# Patient Record
Sex: Female | Born: 1955
Health system: Southern US, Community
[De-identification: ages and names within clinical notes are randomized; demographics above are authoritative.]

## PROBLEM LIST (undated history)

## (undated) DIAGNOSIS — E119 Type 2 diabetes mellitus without complications: Secondary | ICD-10-CM

## (undated) DIAGNOSIS — K649 Unspecified hemorrhoids: Secondary | ICD-10-CM

## (undated) DIAGNOSIS — G459 Transient cerebral ischemic attack, unspecified: Secondary | ICD-10-CM

## (undated) DIAGNOSIS — N809 Endometriosis, unspecified: Secondary | ICD-10-CM

## (undated) DIAGNOSIS — K219 Gastro-esophageal reflux disease without esophagitis: Secondary | ICD-10-CM

## (undated) DIAGNOSIS — O039 Complete or unspecified spontaneous abortion without complication: Secondary | ICD-10-CM

## (undated) DIAGNOSIS — F419 Anxiety disorder, unspecified: Secondary | ICD-10-CM

## (undated) DIAGNOSIS — D649 Anemia, unspecified: Secondary | ICD-10-CM

## (undated) DIAGNOSIS — E785 Hyperlipidemia, unspecified: Secondary | ICD-10-CM

## (undated) HISTORY — PX: OTHER SURGICAL HISTORY: SHX169

## (undated) HISTORY — DX: Anxiety disorder, unspecified: F41.9

## (undated) HISTORY — DX: Type 2 diabetes mellitus without complications: E11.9

## (undated) HISTORY — DX: Endometriosis, unspecified: N80.9

## (undated) HISTORY — DX: Transient cerebral ischemic attack, unspecified: G45.9

## (undated) HISTORY — PX: KNEE ARTHROSCOPY W/ MENISCAL REPAIR: SHX1877

## (undated) HISTORY — PX: PELVIC LAPAROSCOPY: SHX162

## (undated) HISTORY — DX: Complete or unspecified spontaneous abortion without complication: O03.9

---

## 1997-07-01 ENCOUNTER — Ambulatory Visit (HOSPITAL_COMMUNITY): Admission: RE | Admit: 1997-07-01 | Discharge: 1997-07-01 | Payer: Self-pay | Admitting: Obstetrics and Gynecology

## 1997-07-07 ENCOUNTER — Ambulatory Visit (HOSPITAL_COMMUNITY): Admission: RE | Admit: 1997-07-07 | Discharge: 1997-07-07 | Payer: Self-pay | Admitting: Obstetrics and Gynecology

## 1997-09-04 ENCOUNTER — Other Ambulatory Visit: Admission: RE | Admit: 1997-09-04 | Discharge: 1997-09-04 | Payer: Self-pay | Admitting: Obstetrics and Gynecology

## 1997-10-15 ENCOUNTER — Emergency Department (HOSPITAL_COMMUNITY): Admission: EM | Admit: 1997-10-15 | Discharge: 1997-10-15 | Payer: Self-pay | Admitting: Emergency Medicine

## 1998-01-05 ENCOUNTER — Ambulatory Visit (HOSPITAL_COMMUNITY): Admission: RE | Admit: 1998-01-05 | Discharge: 1998-01-05 | Payer: Self-pay | Admitting: Obstetrics and Gynecology

## 1998-02-09 ENCOUNTER — Emergency Department (HOSPITAL_COMMUNITY): Admission: EM | Admit: 1998-02-09 | Discharge: 1998-02-09 | Payer: Self-pay | Admitting: Internal Medicine

## 1998-02-09 ENCOUNTER — Encounter: Payer: Self-pay | Admitting: Emergency Medicine

## 1998-05-09 ENCOUNTER — Emergency Department (HOSPITAL_COMMUNITY): Admission: EM | Admit: 1998-05-09 | Discharge: 1998-05-09 | Payer: Self-pay | Admitting: Emergency Medicine

## 1998-05-09 ENCOUNTER — Encounter: Payer: Self-pay | Admitting: Emergency Medicine

## 1998-08-09 ENCOUNTER — Emergency Department (HOSPITAL_COMMUNITY): Admission: EM | Admit: 1998-08-09 | Discharge: 1998-08-09 | Payer: Self-pay

## 1998-09-09 ENCOUNTER — Encounter: Payer: Self-pay | Admitting: Obstetrics and Gynecology

## 1998-09-09 ENCOUNTER — Ambulatory Visit (HOSPITAL_COMMUNITY): Admission: RE | Admit: 1998-09-09 | Discharge: 1998-09-09 | Payer: Self-pay | Admitting: Obstetrics and Gynecology

## 1999-01-17 ENCOUNTER — Encounter: Payer: Self-pay | Admitting: Surgery

## 1999-01-18 ENCOUNTER — Ambulatory Visit (HOSPITAL_COMMUNITY): Admission: RE | Admit: 1999-01-18 | Discharge: 1999-01-19 | Payer: Self-pay | Admitting: Surgery

## 1999-01-27 ENCOUNTER — Other Ambulatory Visit: Admission: RE | Admit: 1999-01-27 | Discharge: 1999-01-27 | Payer: Self-pay | Admitting: Obstetrics and Gynecology

## 1999-05-02 HISTORY — PX: LAPAROSCOPIC CHOLECYSTECTOMY: SUR755

## 1999-08-22 ENCOUNTER — Encounter: Payer: Self-pay | Admitting: Obstetrics and Gynecology

## 1999-08-22 ENCOUNTER — Encounter: Admission: RE | Admit: 1999-08-22 | Discharge: 1999-08-22 | Payer: Self-pay | Admitting: Obstetrics and Gynecology

## 1999-10-03 ENCOUNTER — Emergency Department (HOSPITAL_COMMUNITY): Admission: EM | Admit: 1999-10-03 | Discharge: 1999-10-03 | Payer: Self-pay | Admitting: Cardiology

## 2000-02-02 ENCOUNTER — Other Ambulatory Visit: Admission: RE | Admit: 2000-02-02 | Discharge: 2000-02-02 | Payer: Self-pay | Admitting: Obstetrics and Gynecology

## 2000-08-10 ENCOUNTER — Encounter: Payer: Self-pay | Admitting: Obstetrics and Gynecology

## 2000-08-10 ENCOUNTER — Encounter: Admission: RE | Admit: 2000-08-10 | Discharge: 2000-08-10 | Payer: Self-pay | Admitting: Obstetrics and Gynecology

## 2000-08-31 ENCOUNTER — Encounter: Payer: Self-pay | Admitting: Obstetrics and Gynecology

## 2000-08-31 ENCOUNTER — Encounter: Admission: RE | Admit: 2000-08-31 | Discharge: 2000-08-31 | Payer: Self-pay | Admitting: Obstetrics and Gynecology

## 2000-10-06 ENCOUNTER — Emergency Department (HOSPITAL_COMMUNITY): Admission: EM | Admit: 2000-10-06 | Discharge: 2000-10-07 | Payer: Self-pay | Admitting: Emergency Medicine

## 2000-10-07 ENCOUNTER — Encounter: Payer: Self-pay | Admitting: Emergency Medicine

## 2000-12-06 ENCOUNTER — Ambulatory Visit (HOSPITAL_COMMUNITY): Admission: RE | Admit: 2000-12-06 | Discharge: 2000-12-06 | Payer: Self-pay | Admitting: Gastroenterology

## 2001-01-07 ENCOUNTER — Encounter: Payer: Self-pay | Admitting: Obstetrics and Gynecology

## 2001-01-07 ENCOUNTER — Encounter: Admission: RE | Admit: 2001-01-07 | Discharge: 2001-01-07 | Payer: Self-pay | Admitting: Obstetrics and Gynecology

## 2001-05-10 ENCOUNTER — Encounter: Payer: Self-pay | Admitting: Orthopedic Surgery

## 2001-05-10 ENCOUNTER — Encounter: Admission: RE | Admit: 2001-05-10 | Discharge: 2001-05-10 | Payer: Self-pay | Admitting: Orthopedic Surgery

## 2001-10-18 ENCOUNTER — Ambulatory Visit (HOSPITAL_COMMUNITY): Admission: RE | Admit: 2001-10-18 | Discharge: 2001-10-18 | Payer: Self-pay | Admitting: Obstetrics and Gynecology

## 2001-10-18 ENCOUNTER — Encounter: Payer: Self-pay | Admitting: Obstetrics and Gynecology

## 2002-05-01 DIAGNOSIS — G459 Transient cerebral ischemic attack, unspecified: Secondary | ICD-10-CM

## 2002-05-01 HISTORY — DX: Transient cerebral ischemic attack, unspecified: G45.9

## 2002-05-24 ENCOUNTER — Emergency Department (HOSPITAL_COMMUNITY): Admission: EM | Admit: 2002-05-24 | Discharge: 2002-05-24 | Payer: Self-pay | Admitting: Emergency Medicine

## 2002-05-24 ENCOUNTER — Encounter: Payer: Self-pay | Admitting: Emergency Medicine

## 2002-05-29 ENCOUNTER — Other Ambulatory Visit: Admission: RE | Admit: 2002-05-29 | Discharge: 2002-05-29 | Payer: Self-pay | Admitting: *Deleted

## 2002-06-20 ENCOUNTER — Encounter: Payer: Self-pay | Admitting: Family Medicine

## 2002-06-20 ENCOUNTER — Encounter: Admission: RE | Admit: 2002-06-20 | Discharge: 2002-06-20 | Payer: Self-pay | Admitting: Family Medicine

## 2002-08-13 ENCOUNTER — Emergency Department (HOSPITAL_COMMUNITY): Admission: EM | Admit: 2002-08-13 | Discharge: 2002-08-13 | Payer: Self-pay

## 2002-08-14 ENCOUNTER — Encounter: Payer: Self-pay | Admitting: Emergency Medicine

## 2002-10-21 ENCOUNTER — Encounter: Payer: Self-pay | Admitting: *Deleted

## 2002-10-21 ENCOUNTER — Ambulatory Visit (HOSPITAL_COMMUNITY): Admission: RE | Admit: 2002-10-21 | Discharge: 2002-10-21 | Payer: Self-pay | Admitting: *Deleted

## 2003-02-26 ENCOUNTER — Inpatient Hospital Stay (HOSPITAL_COMMUNITY): Admission: EM | Admit: 2003-02-26 | Discharge: 2003-02-27 | Payer: Self-pay | Admitting: Emergency Medicine

## 2003-02-27 ENCOUNTER — Encounter: Payer: Self-pay | Admitting: Internal Medicine

## 2003-07-02 ENCOUNTER — Other Ambulatory Visit: Admission: RE | Admit: 2003-07-02 | Discharge: 2003-07-02 | Payer: Self-pay | Admitting: *Deleted

## 2004-05-26 ENCOUNTER — Emergency Department (HOSPITAL_COMMUNITY): Admission: EM | Admit: 2004-05-26 | Discharge: 2004-05-26 | Payer: Self-pay | Admitting: Emergency Medicine

## 2004-08-17 ENCOUNTER — Other Ambulatory Visit: Admission: RE | Admit: 2004-08-17 | Discharge: 2004-08-17 | Payer: Self-pay | Admitting: *Deleted

## 2004-12-21 ENCOUNTER — Emergency Department (HOSPITAL_COMMUNITY): Admission: EM | Admit: 2004-12-21 | Discharge: 2004-12-21 | Payer: Self-pay | Admitting: Emergency Medicine

## 2005-01-03 ENCOUNTER — Emergency Department (HOSPITAL_COMMUNITY): Admission: EM | Admit: 2005-01-03 | Discharge: 2005-01-03 | Payer: Self-pay | Admitting: Emergency Medicine

## 2005-08-18 ENCOUNTER — Emergency Department (HOSPITAL_COMMUNITY): Admission: EM | Admit: 2005-08-18 | Discharge: 2005-08-19 | Payer: Self-pay | Admitting: Emergency Medicine

## 2005-08-21 ENCOUNTER — Other Ambulatory Visit: Admission: RE | Admit: 2005-08-21 | Discharge: 2005-08-21 | Payer: Self-pay | Admitting: Obstetrics & Gynecology

## 2005-08-30 ENCOUNTER — Encounter: Payer: Self-pay | Admitting: Obstetrics and Gynecology

## 2005-09-01 ENCOUNTER — Encounter: Admission: RE | Admit: 2005-09-01 | Discharge: 2005-09-01 | Payer: Self-pay | Admitting: Family Medicine

## 2005-09-06 ENCOUNTER — Encounter: Admission: RE | Admit: 2005-09-06 | Discharge: 2005-09-06 | Payer: Self-pay | Admitting: Family Medicine

## 2005-09-18 ENCOUNTER — Encounter (INDEPENDENT_AMBULATORY_CARE_PROVIDER_SITE_OTHER): Payer: Self-pay | Admitting: Specialist

## 2005-09-18 ENCOUNTER — Ambulatory Visit (HOSPITAL_COMMUNITY): Admission: RE | Admit: 2005-09-18 | Discharge: 2005-09-18 | Payer: Self-pay | Admitting: Gastroenterology

## 2005-09-26 ENCOUNTER — Ambulatory Visit: Payer: Self-pay | Admitting: Internal Medicine

## 2005-12-04 ENCOUNTER — Ambulatory Visit: Payer: Self-pay | Admitting: Internal Medicine

## 2006-03-21 ENCOUNTER — Emergency Department (HOSPITAL_COMMUNITY): Admission: EM | Admit: 2006-03-21 | Discharge: 2006-03-21 | Payer: Self-pay | Admitting: Emergency Medicine

## 2006-03-28 ENCOUNTER — Ambulatory Visit: Payer: Self-pay | Admitting: Internal Medicine

## 2006-06-13 ENCOUNTER — Inpatient Hospital Stay (HOSPITAL_COMMUNITY): Admission: EM | Admit: 2006-06-13 | Discharge: 2006-06-14 | Payer: Self-pay | Admitting: Emergency Medicine

## 2006-06-14 ENCOUNTER — Encounter (INDEPENDENT_AMBULATORY_CARE_PROVIDER_SITE_OTHER): Payer: Self-pay | Admitting: *Deleted

## 2006-08-03 ENCOUNTER — Ambulatory Visit: Payer: Self-pay | Admitting: Internal Medicine

## 2006-08-23 ENCOUNTER — Other Ambulatory Visit: Admission: RE | Admit: 2006-08-23 | Discharge: 2006-08-23 | Payer: Self-pay | Admitting: Obstetrics & Gynecology

## 2007-04-23 IMAGING — CR DG CHEST 2V
2 series · 2 of 2 positions shown · non-contrast
Comparison: Report from prior chest radiograph dated 02/26/03.

CLINICAL DATA: Left chest pain.  
 2-VIEW CHEST RADIOGRAPH:

[w chest pa]
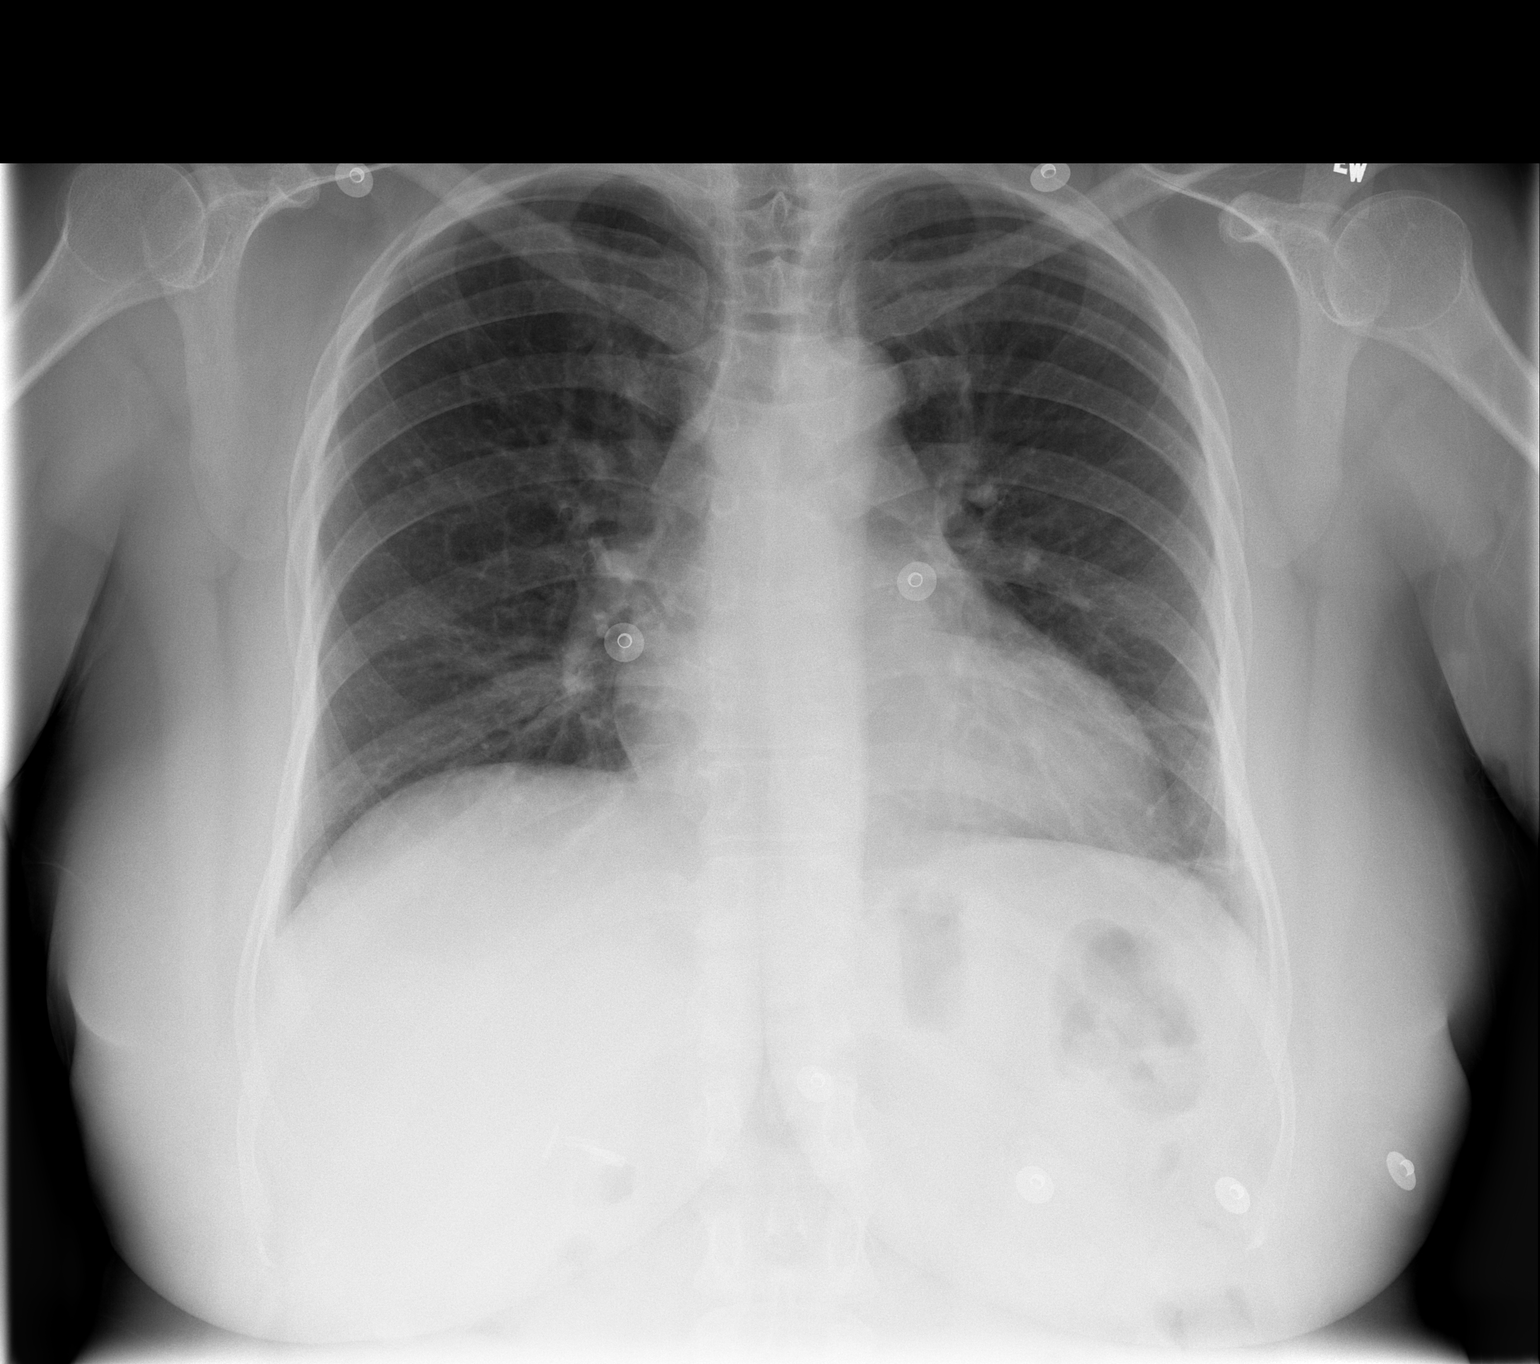

[w chest lat]
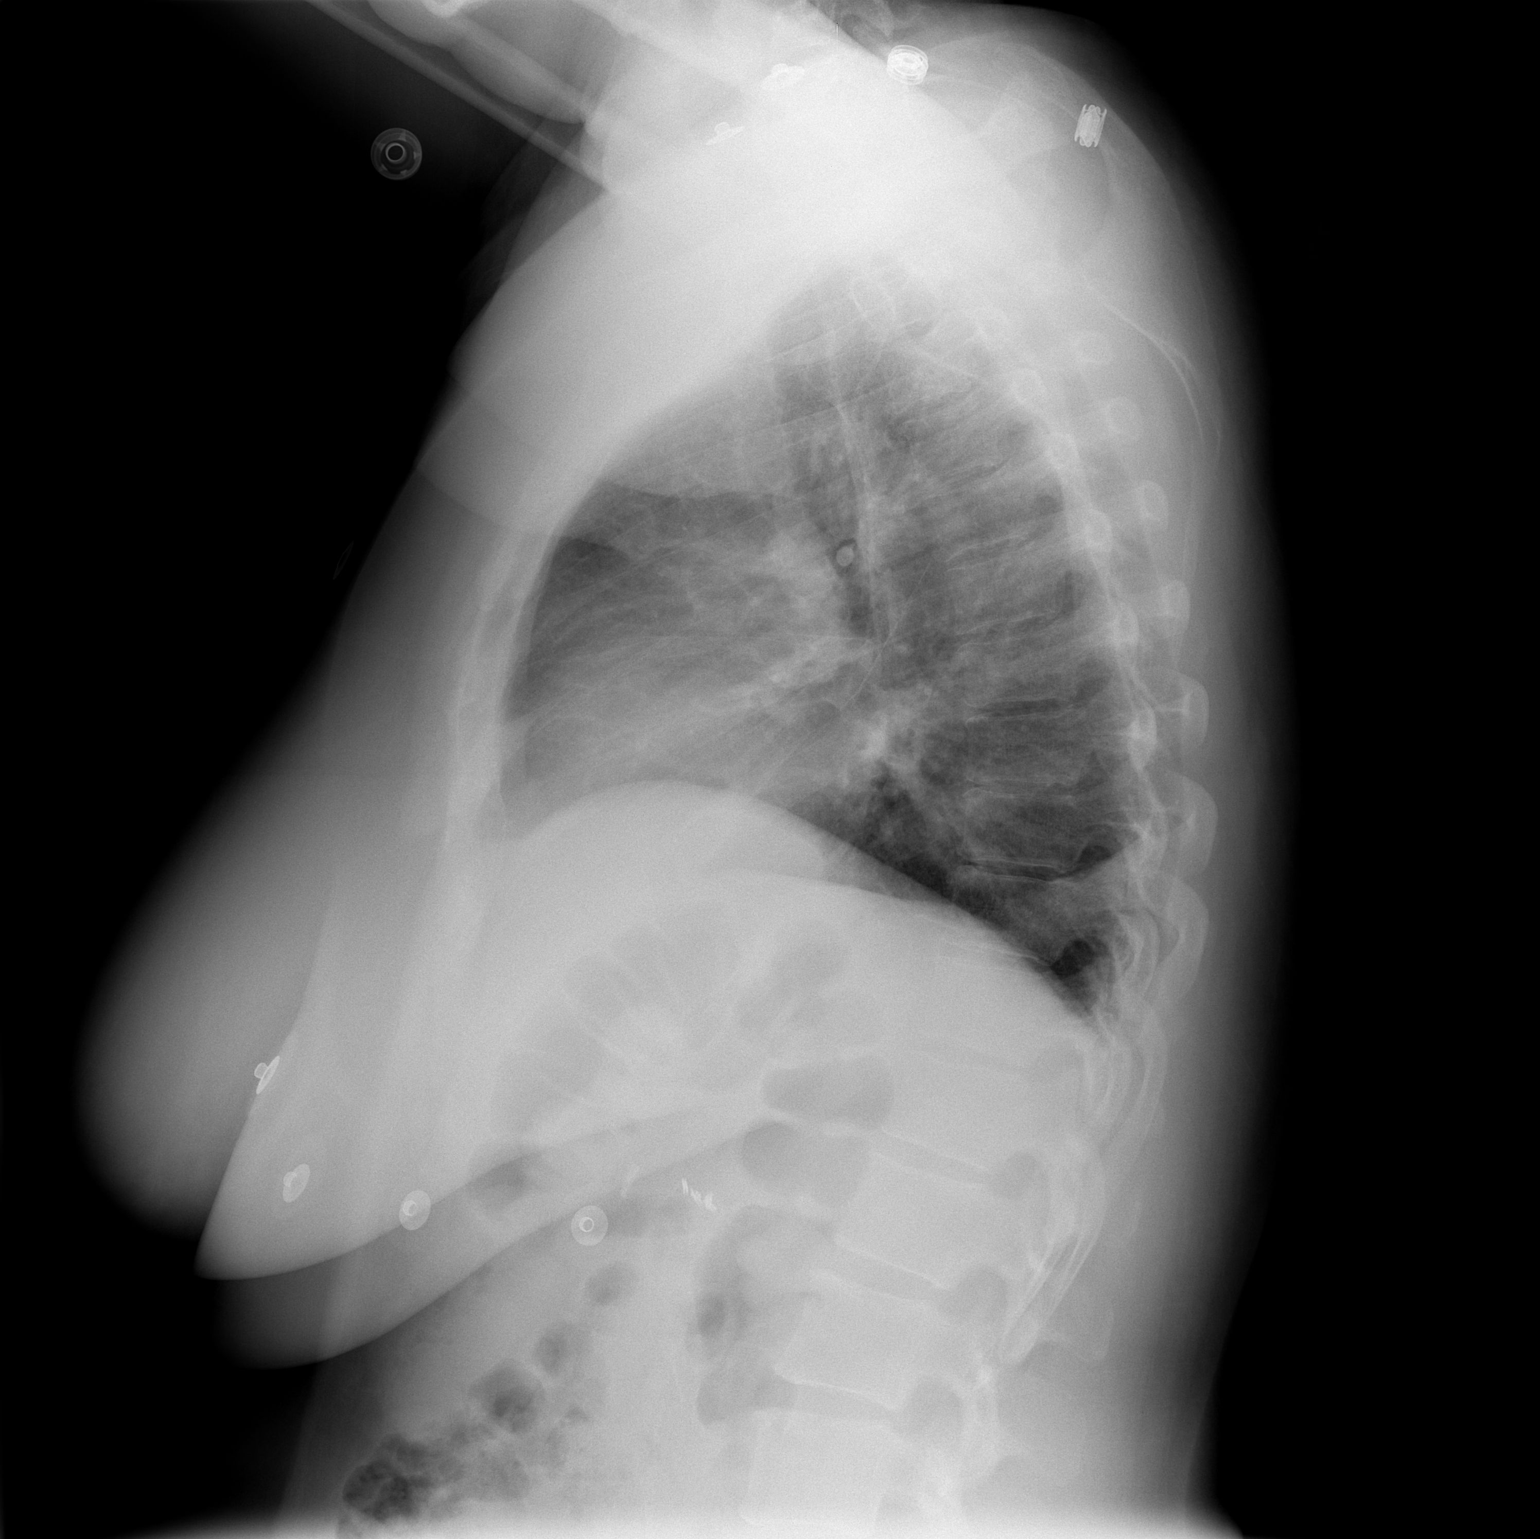

[2 of 2 positions shown; findings below may reference images not displayed]

FINDINGS: There is subsegmental atelectasis or scarring at the left costophrenic angle.  Similar findings were noted on the prior exam.  Right lung appears clear.  There is mild cardiomegaly.  No edema.  A focal density in the left hilar region likely represents granuloma.
IMPRESSION: 1.  Cardiomegaly but no edema. 
 2.  Subsegmental atelectasis versus scarring at the left base.

## 2007-05-03 ENCOUNTER — Emergency Department (HOSPITAL_COMMUNITY): Admission: EM | Admit: 2007-05-03 | Discharge: 2007-05-03 | Payer: Self-pay | Admitting: Emergency Medicine

## 2007-07-15 ENCOUNTER — Emergency Department (HOSPITAL_COMMUNITY): Admission: EM | Admit: 2007-07-15 | Discharge: 2007-07-15 | Payer: Self-pay | Admitting: Emergency Medicine

## 2007-08-28 ENCOUNTER — Other Ambulatory Visit: Admission: RE | Admit: 2007-08-28 | Discharge: 2007-08-28 | Payer: Self-pay | Admitting: Obstetrics & Gynecology

## 2010-01-09 ENCOUNTER — Emergency Department (HOSPITAL_BASED_OUTPATIENT_CLINIC_OR_DEPARTMENT_OTHER): Admission: EM | Admit: 2010-01-09 | Discharge: 2010-01-09 | Payer: Self-pay | Admitting: Emergency Medicine

## 2010-01-09 ENCOUNTER — Ambulatory Visit: Payer: Self-pay | Admitting: Diagnostic Radiology

## 2010-02-10 ENCOUNTER — Observation Stay (HOSPITAL_COMMUNITY): Admission: EM | Admit: 2010-02-10 | Discharge: 2010-02-10 | Payer: Self-pay | Admitting: Emergency Medicine

## 2010-02-10 ENCOUNTER — Ambulatory Visit: Payer: Self-pay | Admitting: Cardiology

## 2010-02-10 ENCOUNTER — Encounter (INDEPENDENT_AMBULATORY_CARE_PROVIDER_SITE_OTHER): Payer: Self-pay | Admitting: Emergency Medicine

## 2010-07-13 LAB — POCT CARDIAC MARKERS
Myoglobin, poc: 67.1 ng/mL (ref 12–200)
Myoglobin, poc: 77.2 ng/mL (ref 12–200)
Troponin i, poc: <0.05 ng/mL (ref 0.00–0.09)
Troponin i, poc: <0.05 ng/mL (ref 0.00–0.09)

## 2010-07-13 LAB — DIFFERENTIAL
Basophils Absolute: 0.1 10*3/uL (ref 0.0–0.1)
Eosinophils Absolute: 0.1 10*3/uL (ref 0.0–0.7)
Eosinophils Relative: 1 % (ref 0–5)
Lymphocytes Relative: 38 % (ref 12–46)
Monocytes Absolute: 0.7 10*3/uL (ref 0.1–1.0)

## 2010-07-13 LAB — CBC
HCT: 39.9 % (ref 36.0–46.0)
MCH: 30 pg (ref 26.0–34.0)
MCV: 89.3 fL (ref 78.0–100.0)
RDW: 13.1 % (ref 11.5–15.5)
WBC: 8.3 10*3/uL (ref 4.0–10.5)

## 2010-07-13 LAB — BASIC METABOLIC PANEL
BUN: 11 mg/dL (ref 6–23)
CO2: 27 mEq/L (ref 19–32)
Calcium: 8.9 mg/dL (ref 8.4–10.5)
Chloride: 105 mEq/L (ref 96–112)
Creatinine, Ser: 0.72 mg/dL (ref 0.4–1.2)
Glucose, Bld: 103 mg/dL — ABNORMAL HIGH (ref 70–99)

## 2010-07-14 LAB — DIFFERENTIAL
Basophils Absolute: 0.1 10*3/uL (ref 0.0–0.1)
Basophils Relative: 1 % (ref 0–1)
Eosinophils Absolute: 0.1 10*3/uL (ref 0.0–0.7)
Eosinophils Relative: 1 % (ref 0–5)
Monocytes Absolute: 0.6 10*3/uL (ref 0.1–1.0)
Monocytes Relative: 6 % (ref 3–12)

## 2010-07-14 LAB — COMPREHENSIVE METABOLIC PANEL
ALT: 23 U/L (ref 0–35)
AST: 33 U/L (ref 0–37)
Albumin: 3.7 g/dL (ref 3.5–5.2)
Alkaline Phosphatase: 65 U/L (ref 39–117)
BUN: 16 mg/dL (ref 6–23)
Calcium: 9 mg/dL (ref 8.4–10.5)
GFR calc non Af Amer: >60 mL/min (ref >60)
Glucose, Bld: 98 mg/dL (ref 70–99)
Total Bilirubin: 0.8 mg/dL (ref 0.3–1.2)

## 2010-07-14 LAB — POCT CARDIAC MARKERS: Myoglobin, poc: 52.2 ng/mL (ref 12–200)

## 2010-07-14 LAB — CBC
HCT: 36.6 % (ref 36.0–46.0)
Hemoglobin: 12.4 g/dL (ref 12.0–15.0)
MCH: 31 pg (ref 26.0–34.0)
MCHC: 34 g/dL (ref 30.0–36.0)

## 2010-09-16 NOTE — Discharge Summary (Signed)
Jasmine Hicks, Jasmine Hicks                      ACCOUNT NO.:  [id number]   MEDICAL RECORD NO.:  [id number]                   PATIENT TYPE:  INP   LOCATION:  3040                                 FACILITY:  [facility]   PHYSICIAN:  Pramod P. Pearlean Brownie, MD                 DATE OF BIRTH:  22-Mar-1956   DATE OF ADMISSION:  02/26/2003  DATE OF DISCHARGE:  02/27/2003                                 DISCHARGE SUMMARY   DIAGNOSES AT TIME OF DISCHARGE:  1. Right brain transient ischemic attack.  2. Hyperlipidemia.  3. History of epistaxis.  4. Probable allergic rhinitis.  5. Migraines.  6. Hypertension.   MEDICATIONS ON ADMISSION:  Per records, on no medicines prior to admission.   MEDICATIONS AT TIME OF DISCHARGE:  1. Aspirin 81 mg daily.  2. Zocor 20 mg daily.  3. Cipro 250 mg b.i.d. x3 days.  4. Clarinex 5 mg a day p.r.n.   STUDIES PERFORMED:  1. CT of the head as negative on admission for acute stroke or abnormality.  2. MRI of the brain negative for acute stroke.  3. MRA of the neck normal.  4. Carotid Doppler normal without ICA stenosis, vertebral flow antegrade.  5. A 2-D echocardiogram with no embolic source, EF 65%.   LABORATORY STUDIES:  Cholesterol 241, triglycerides 106, HDL 50, LDL 170,  homocystine completed but results are pending.  Glucose elevated at 104.  Otherwise CMET and CBC within normal limits.  Coagulation studies are  normal.   HISTORY OF PRESENT ILLNESS:  The patient is a 55 year old Hispanic, right-  handed female with a history of nose bleed, who noted onset of severe  headache and hypertension the morning of February 26, 2003.  She went to her  family doctor's office, and they noticed some left facial droop.  She  reported tingling.  She was sent to the emergency room where a CT scan of  the head was negative for acute stroke.  She was admitted to the hospital  for further workup.   No acute infarct was noted.  It was felt the patient probably had right  brain  transient ischemic attack associated with left facial symptoms.  The  patient was started on aspirin in the hospital for secondary prevention.  We  will decrease to 81 mg with history of epistaxis.  She prefers __________  Aggrenox or Plavix at this time.   RISK FACTORS IDENTIFIED:  1. Elevated cholesterol with significantly elevated LDL at 170.  We will     start her on statin for this.  Will need follow up in six to eight weeks.  2. Other studies negative for risk factors except for homocystine which     remains pending at time of discharge.  3. Neurological deficits, resolved.   DISCHARGE CONDITION:  1. No focal deficits.  2. No neurological abnormalities.   DISCHARGE PLAN:  1. Discharge home.  2. Aspirin  81 mg for secondary stroke prevention.  3. Statin for elevated cholesterol.  Will need followup in six to eight     weeks.  4. Followup homocystine as outpatient.  If elevated, treated with Foltx one     daily.  Include vitamin B complex.  5. Follow up with primary physician in one month.  6. Follow up with Dr. Janalyn Shy P. Sethi in two months.      Jasmine Hicks, N.P.                         Pramod P. Pearlean Brownie, MD    SB/MEDQ  D:  02/27/2003  T:  02/27/2003  Job:  [id number]

## 2010-09-16 NOTE — Assessment & Plan Note (Signed)
 HEALTHCARE                             PULMONARY OFFICE NOTE   SIDONIA, NUTTER                   MRN:          [id number]  DATE:08/03/2006                            DOB:          1955/09/25    PULMONARY/FINAL FOLLOWUP OFFICE VISIT   A 55 year old female with a chronic cough that has waxed and waned since  May of 2007, overall much better when she remembers to take Zegerid, but  worse with the change of seasons, associated with watery rhinitis and  sensation of post-nasal drainage.  She is taking claritin with minimum  benefit.  She denies any pleuritic pain, fevers, chills, sweats,  unintended weight loss, or history of hemoptysis.   PHYSICAL EXAMINATION:  She is a pleasant, ambulatory female in no acute  distress.  VITAL SIGNS:  Stable vital signs.  HEENT:  Minimal turbinate edema with nonspecific features.  Oropharynx  is clear.  NECK:  Supple without cervical adenopathy or tenderness. Trachea is  midline.  No thyromegaly.  LUNGS:  Fields are perfectly clear bilaterally to auscultation and  percussion.  CARDIAC:  Regular rate and rhythm without murmur, gallop, or rub.  ABDOMEN:  Soft and benign.  EXTREMITIES:  Warm without calf tenderness, cyanosis, clubbing, or  edema.   Chest x-ray does not reveal any nodule in the right middle lobe  distribution.   IMPRESSION:  1. Microscopic nodule in the right middle lobe by CT scan over a      year ago with no plain x-ray equivalent. Given the fact that she is      a never smoker and the distribution of this nodule, I think it is      extremely unlikely that it is malignant.  I would recommend that      she simply have it followed up with a chest x-ray in a year.  2. In terms of her cough, I believe that she has nonspecific and/or      allergic rhinitis with post-nasal drainage that then triggers      coughing, which then triggers reflux.  I have recommended a trial      of Drixoral Cold  and Allergy p.r.n., but also recommended that,      whenever she is coughing, she needs to remember to take the Zegerid      perfectly regularly 40 mg at bedtime to eliminate the cyclical      component of the cough that tends to develop in this setting.     Charlaine Dalton. Sherene Sires, MD, [facility]  Electronically Signed    MBW/MedQ  DD: 08/03/2006  DT: 08/04/2006  Job #: [id number]

## 2010-09-16 NOTE — H&P (Signed)
NAMEBAHAR, SHELDEN NO.:  [id number]   MEDICAL RECORD NO.:  [id number]                   PATIENT TYPE:  INP   LOCATION:  1828                                 FACILITY:  [facility]   PHYSICIAN:  Melvyn Novas, M.D.               DATE OF BIRTH:  09/21/1955   DATE OF ADMISSION:  02/26/2003  DATE OF DISCHARGE:                                HISTORY & PHYSICAL   IDENTIFYING INFORMATION:  Mrs. Gritz is admitted on February 26, 2003 to  the stroke MD service.   PRIMARY CARE PHYSICIAN:  Francis P. Modesto Charon, M.D., [facility] at  [facility], [address]   REASON FOR ADMISSION:  This is a pleasant, 55 year old Filipino female,  right-handed who states that she has a history of frequent, sometimes  violent nosebleeds and reports, this morning, onset of headache,  hypertension in the early a.m. that urged her to see her to see her primary  care physician.  She drove to the family doctor, and at about 9 a.m. was  seen to have left arm and face tingling and numbness as well as left arm  drift, according to Dr. Nash Dimmer note.  He asked the patient to be seen in the  ER.  The couple came over here to [facility] and was awaiting  neurological consultation here.   A CT was obtained which was negative for intracranial abnormalities or  bleed.  CBC and Chem-7 were pending until I saw the patient, which returned  negative.  A blood pressure that was initially elevated in the 180s and 160s  systolic has been more normalized to 147; respiratory rate has been down  from 20-16.  Temperature is afebrile.   PAST MEDICAL HISTORY:  The patient has recently suffered from bronchitis.  She took a cough syrup, she believes Robitussin.  She suffers otherwise  frequent nosebleeds.  New to her is the hypertension.  She has a history of  hypercholesterolemia and migraine headache.  The patient says that she has  been worked up in the past for chest pain and  the Cardiolite in 2000 was  negative.  She also had a cholecystectomy in September 2000.   SOCIAL HISTORY:  The patient is married, lives with her husband who was a  member of the [place] [employer].  The couple immigrated to the [place] in 1977.  They have  2 children born in 95 and 1988 who are healthy.  She lives in [place],  employed as a Research scientist (medical).  She denies smoking or alcohol use.  She  drinks sometimes caffeine.   FAMILY HISTORY:  Diabetes mellitus in her father.  Breast cancer in the  maternal family.  Otherwise no abnormalities other than  hypercholesterolemia.   MEDICATIONS:  She just finished a Z-Pak and she took Robitussin syrup. I  understand that she took Naprosyn for headache p.r.n. and that she has  sometime  in the past been treated with Darvocet p.r.n. for headache.  She  has no workup for cardiopathy.   ALLERGIES:  She has no known drug allergies.   PHYSICAL EXAMINATION:  ABDOMEN:  Soft, nontender, nondistended.  No carotid  bruits.  No focal tenderness, muscle spasm, clubbing, cyanosis or edema.  Full bowel sounds.  MENTAL STATUS:  Alert and oriented x3, no dysphagia, dysarthria, or apraxia.  Normal attention span, normal memory.  HEENT:  Full visual fields.  Pupils are equal to light and accommodation.  No papilledema. The left facial structures seem to show a mild droop around  the angle of the mouth.  There is also the palpebral fold that is asymmetric  in comparison to the right and seems larger.  The patient describes a  tingling over the left face that includes all 3 branches of the trigeminal  nerve:  Frontal, maxillary and mandibular.  NECK:  She states that this has not involved the neck.  She has normal neck  movement, range of motion, no crepitation, no weakness.  NEUROLOGIC:  She describes in her sensory exam otherwise besides the facial  tingling, also arm and hand tingling in all fingers.  MOTOR EXAM:  Weakness--the grip on the left is significantly  weaker than on  the right, but no pronator drift is seen and the finger-to-nose test is  without any coordination deficits.  The deep tendon reflexes were equal.  There is no Babinski elicited. Gait is fully intact. Romberg is negative.   ASSESSMENT:  1. Left facial sensory loss.  2. Left upper extremity sensory impairment.  3. Alteration of sensory and decreased grip and facial droop.   PLAN:  Will admit to evaluate for possible right-sided brain hemispheric  ischemic insult or TIA versus complicated migraine given the patient's  history of headache at onset.  No history was given of demyelination in  spite of the patient's history of frequent nosebleeds she does not seem to  suffer from a coagulopathy and has never been worked up for this.  The plan  is to obtain MRI with MRA, carotid Doppler, 2-D echocardiogram, and see  stroke admission planning.                                                Melvyn Novas, M.D.    CD/MEDQ  D:  02/26/2003  T:  02/26/2003  Job:  [id number]   cc:   [facility]   Maryla Morrow. Modesto Charon, M.D.  [address]  [place]  [place] [address]  Fax: [phone]   Carlisle Beers. Rendall III, M.D.  [address] [place]  [place] [address]  Fax: [phone]

## 2010-09-16 NOTE — Op Note (Signed)
Jasmine Hicks, SANDERLIN NO.:  [id number]   MEDICAL RECORD NO.:  [id number]          PATIENT TYPE:  AMB   LOCATION:  ENDO                         FACILITY:  [facility]   PHYSICIAN:  Anselmo Rod, M.D.  DATE OF BIRTH:  1955-12-18   DATE OF PROCEDURE:  09/18/2005  DATE OF DISCHARGE:                                 OPERATIVE REPORT   PROCEDURE:  Colonoscopy with multiple cold biopsies.   ENDOSCOPIST:  Charna Elizabeth, M.D.   INSTRUMENT USED:  Olympus video colonoscope.   INDICATIONS FOR PROCEDURE:  A 55 year old female undergoing a screening  colonoscopy.  Rule out colonic polyps, masses, etc.  The patient has a  history of rectal bleeding and a family history of colon cancer.  She also  has a history of hemorrhoids and constipation.   PREPROCEDURE PREPARATION:  Informed consent was procured from the patient.  The patient was fasted for four hours prior to the procedure and prepped  with OsmoPrep pills the night of and in the morning of the procedure.  Risks  and benefits of the procedure including a 10% misread of cancer and polyps  were discussed as well.   PREPROCEDURE PHYSICAL:  VITAL SIGNS:  The patient had stable vital signs.  NECK:  Supple.  CHEST:  Clear to auscultation.  CARDIAC:  S1 and S2 regular.  ABDOMEN:  Soft with normal bowel sounds.   DESCRIPTION OF PROCEDURE:  The patient was placed in the left lateral  decubitus position and sedated with 150 mcg of fentanyl and 14 mg of Versed  in slow incremental doses.  Once the patient was adequately sedated,  maintained on low-flow oxygen and continuous cardiac monitoring, the Olympus  video colonoscope was advanced from the rectum to the cecum.  Four small  sessile polyps were biopsied from the left colon.  Prominent internal  hemorrhoids were seen on retroflexion.  The rest of the colonic mucosa  including the transverse colon, right colon, cecum, and terminal ileum were  appeared normal.   IMPRESSION:  1.   Prominent internal hemorrhoids which I suspect as the source of the      patient's rectal bleeding.  2.  Four small sessile polyps removed by cold biopsy from the left colon.  3.  Normal-appearing transverse colon, right colon, cecum, and terminal      ileum.   RECOMMENDATIONS:  1.  Await pathology results.  2.  Avoid all nonsteroidals including aspirin for now.  3.  Anusol HC 2.5% suppositories have been called into the patient's      pharmacy, [facility] on [address]  She is      to insert one suppository per rectum for the next two weeks.  4.  Outpatient followup in the next two weeks for further recommendations.  5.  Repeat colonoscopy depending on pathology results.      Anselmo Rod, M.D.  Electronically Signed     JNM/MEDQ  D:  09/18/2005  T:  09/19/2005  Job:  [id number]

## 2010-09-16 NOTE — Discharge Summary (Signed)
NAMEJACQUES, Jasmine Hicks NO.:  [id number]   MEDICAL RECORD NO.:  [id number]          PATIENT TYPE:  INP   LOCATION:  4741                         FACILITY:  [facility]   PHYSICIAN:  Michael L. Reynolds, M.D.DATE OF BIRTH:  1955/06/08   DATE OF ADMISSION:  06/13/2006  DATE OF DISCHARGE:  06/14/2006                               DISCHARGE SUMMARY   DIAGNOSES AT TIME OF DISCHARGE:  1. Numbness with no radiologic evidence of acute stroke versus      migraine.  2. History of stroke in October of 2004.  3. Hypertension.  4. History of dyslipidemia.  5. Epistaxis.  6. Pulmonary nodule.  7. Headaches.   MEDICINES AT TIME OF DISCHARGE:  1. Atenolol 25 mg a day.  2. Aspirin 81 mg a day.  3. Amoxicillin 875 mg b.i.d. until completed.  4. Zocor 20 mg a day, start the night you get blood drawn.   STUDIES PERFORMED:  1. CT of the brain, on admission, shows no acute abnormalities.  2. MRI of the brain shows no acute abnormalities.  Minimal sinus      disease.  Favorable appearing probable perivascular space in the      left putaminal region.  3. MRA of the head shows no occlusions, stenosis or dissections.  4. Two-D echocardiogram shows EF of 55-60% with no left ventricular      regional wall motion abnormalities.  5. Carotid Doppler shows no ICA stenosis, vertebral artery flow      antegrade.  6. Transcranial Doppler completed, results pending.  7. EKG shows sinus bradycardia; otherwise, normal ECG.   LABORATORY STUDIES:  CBC normal.  Chemistry normal.  Coagulation studies  normal.  Liver function tests with ALT 37; otherwise, normal.  Albumin  3.4.  Cardiac enzymes negative.  Calcium normal.  Urinalysis with 0-2  white blood cells, 3-6 red blood cells, many squamous epithelial, but no  nitrites or leukocyte esterase.  Urine drug screen negative.  Hemoglobin  A1c 5.8.   HISTORY OF PRESENT ILLNESS:  Ms. Jasmine Hicks is a 55 year old female with  past medical history of TIA  with stroke admission in October of 2004.  Patient states she was at her baseline until last night when she noticed  a tight pain in her left lower leg, which went up her leg and bothered  her all night.  This morning, it is some better.  She is able to walk  normally, but at 11 a.m. she noticed a numb sensation in her leg, along  with clumsiness in her left hand and was unable to hold a pen and write  her name.  She called her primary physician who ordered her to come to  the emergency room.  She is feeling better now, but still a little funny  on the left.  Patient notes increased headaches for the past 3 weeks.  She restarted her atenolol a couple of days ago for her elevated blood  pressure.  She has been on aspirin in the past, but has not taken it  recently.  She was admitted to the hospital for a stroke evaluation.  HOSPITAL COURSE:  MRI was negative for acute stroke.  Pain and this  numbness sensation has lasted greater than 24 hours; however, there is  no radiologic evidence for acute stroke.  Question nonradiological  stroke versus possible migraine.  Patient has been on multiple  medications in the past for stroke and has stopped one and restarted  another over the past several months.  We will resume her atenolol,  aspirin, Zocor and we will continue the antibiotics she is currently on  until it is completed.  She is back to her neurologic baseline and  stable for discharge home.   DISCHARGE PLAN:  1. Discharge home with family.  2. Aspirin for secondary stroke prevention.  3. Patient needs tight risk factor control with LDL less than 100,      systolic blood pressure less than 130 and hemoglobin A1c less than      6.5.  4. Get outpatient stroke labs drawn.  Patient was given a prescription      and instructions to do this.  5. Follow up with primary care physician, Dr. Concepcion Elk within 1 month.  6. Follow up with Dr. Thad Ranger at [facility] in 2 months.       [name], N.P.      Marolyn Hammock. Thad Ranger, M.D.  Electronically Signed    SB/MEDQ  D:  06/14/2006  T:  06/15/2006  Job:  [id number]   cc:   Fleet Contras, M.D.

## 2010-09-16 NOTE — H&P (Signed)
Jasmine Hicks, MARRS NO.:  [id number]   MEDICAL RECORD NO.:  [id number]          PATIENT TYPE:  INP   LOCATION:  4741                         FACILITY:  [facility]   PHYSICIAN:  Michael L. Reynolds, M.D.DATE OF BIRTH:  [birth year]   DATE OF ADMISSION:  06/13/2006  DATE OF DISCHARGE:  06/14/2006                              HISTORY & PHYSICAL   CHIEF COMPLAINT:  Left-sided numbness and weakness.   HISTORY OF PRESENT ILLNESS:  This is the second [facility] stroke  service admission for this 55 year old woman with past medical history  which includes a TIA which preceded a stroke, admitted in October of  2004 as well as history of reported hypertension.  The patient said that  she was doing fairly well until last evening.  At that time, she noticed  a tight sensation in her left foot and lower part of her leg which  radiated up her leg.  This bothered on and off all night and interfered  with her sleep.  It seemed to be a little bit better this morning.  She  was able to walk around normally.  At that time, she was not aware of  any facial or upper extremity symptoms.  However, later in the morning,  about 11:00, she noted a numb sensation in the left leg along with  clumsiness of her dominant left hand to the point that she had  difficulty hold a pen.  She did not noticed any facial or speech  symptoms.  She called her primary physician and was advised to come to  the emergency department for evaluation.  She said that she is feeling  better but still feels a little bit funny on the left side.  The  patient notes that, over the past three weeks, she has increased  headaches.  She relates this as a possibility of her blood pressure  being high and has been back on atenolol which she had not been taking,  for the last couple of days.  She also states that, for the last couple  of weeks, she has been a little bit concerned about vascular events and,  therefore, has been  taking an aspirin a day.   PAST MEDICAL HISTORY:  She was admitted to the stroke service in October  of 2004 after presenting with left-sided numbness.  A workup at that  time was negative although for acute infarct, but she was found to have  elevated blood pressure and cholesterol and was discharged on aspirin,  Zocor and blood pressure medicines.  She states that after she took her  blood pressure medicine for a little while, she was dizzy all the time  and ultimately came off of her blood pressure medicine.  She has had  several bouts of epistaxis in the past although this has not been a  problem for her lately.  She has a headache which has been described as  migraines.   FAMILY HISTORY:  Her mother died of heart disease in her mid 64s.  Father also had heart disease in his 31s.  Also positive for diabetes  and  hypertension.   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  She denies any history of tobacco or alcohol use and is normally  independent in activities of daily living.  [place]   ALLERGIES:  TETANUS.   MEDICATIONS:  Aspirin and atenolol of uncertain doses, as noted above.  She has also been on Zegerid in the recent past for a chronic cough  which seems to have improved.   REVIEW OF SYSTEMS:  They are remarkable for headaches for the last three  weeks, as above.  She also reports that she has been a little bit more  upset over the past few weeks due to the death of her dog.  Otherwise,  full assessment of review of systems is negative except as outlined in  the HPI and in the accompanying nursing record.   PHYSICAL EXAMINATION:  VITAL SIGNS:  Temperature of 98.2, blood pressure  of 114/73, pulse 70, respirations 18.  GENERAL:  This is a healthy-appearing woman seen with no evidence of  distress.  HEENT:  Normocephalic and atraumatic.  Oropharynx is benign.  NECK:  Supple without carotid or  supraclavicular bruits.  HEART:  Regular rate and rhythm without murmurs.  CHEST:  Clear to auscultation bilaterally.  EXTREMITIES:  No edema, 2+ pulses.  NEUROLOGICAL:  Mental status--she is awake and alert.  She is fully  oriented to time, place and person.  Recent and remote memory are  intact.  Attention span, concentration and fund of knowledge are all  appropriate.  CRANIAL NERVES:  Pupils are equal and reactive.  Extraocular movements  are full without nystagmus.  Visual fields are full to confrontation.  Hearing is intact to conversational speech.  Facial sensation is  diminished on the left compared to the right.  Face, tongue and palate  move normally and symmetrically.  Motor: Normal bulk and tone.  Normal  strength in all tested extremity muscles.  Sensation:  Decreased to  pinprick on the left compared to the right, otherwise, intact  throughout. Coordination: Finger-to-nose is performed adequately.  The  gait is deferred.  Reflexes:  Are 2+ and symmetric.  Toes are downgoing  bilaterally.   LABORATORY REVIEW:  CBC:  White count is 6.7, hemoglobin 13.5, platelets  315,000.  Coagulations are unremarkable.  Prothrombin time is normal.  CT of the head was personally reviewed.  The study is unremarkable.   IMPRESSION:  Left hemisensory changes suspicious for small-vessel  cerebrovascular event in a patient with hypertension and a known history  of transient ischemic attack.   PLAN:  We will admit for routine stroke workup.  We will admit to the  stroke service for a brief stroke workup including MRI of the brain, MRA  intracranial circulation, carotid and transcranial Dopplers, 2D  echocardiogram and stroke labs.  For now, we will continue her aspirin  and her atenolol, and determine on the base of her workup of what, if  any, further medication needs she has.  Stroke Service to follow.      Michael L. Thad Ranger, M.D.  Electronically Signed    MLR/MEDQ  D:   06/13/2006  T:  06/14/2006  Job:  [id number]

## 2010-09-16 NOTE — Assessment & Plan Note (Signed)
[facility]                             PULMONARY OFFICE NOTE   NKENGE, SONNTAG                   MRN:          [id number]  DATE:03/28/2006                            DOB:          1955-09-29    HISTORY:  A 55 year old white female with chronic cough that has waxed  and waned since May 2007 but is overall much better since starting  Zegerid at bedtime.  She also has an unrelated right lower lobe nodule  by CT scan only that does not show up on plain film dated December 04, 2005.  Returns as requested for followup.  She denies any pleuritic or  exertional chest pain, excess sputum production, fevers, chills, sweats,  unintended weight loss.   PHYSICAL EXAMINATION:  GENERAL:  She is a pleasant, ambulatory female in  no acute distress who clears her throat occasionally during her  interview and exam.  She is afebrile with normal vital signs.  No change  in her weight.  HEENT:  Unremarkable.  There is no excess turbinate edema or postnasal  drainage or cobblestoning.  Neck supple without cervical adenopathy.  Trachea is midline, no thyromegaly.  LUNG FIELDS:  Perfectly clear bilaterally to auscultation and percussion  with no cough elicited on inspiratory and expiratory maneuvers.  HEART:  Regular rate and rhythm without murmur, gallop, rub.  ABDOMEN:  Soft, benign.  EXTREMITIES:  Warm without calf tenderness, cyanosis, clubbing, or  edema.   IMPRESSION:  Dramatic improvement in cough along with voice fatigue and  hoarseness on Zegerid, which she admitted to me she is not consistent  about taking but overall has helped her cough about 80-90%.  In this  setting, I do not believe anything more needs to be done other than  lifestyle modification and continue Zegerid for another 3 months.  Ideally if she remains Zegerid dependent (the way to tell would be to  stop the Zegerid completely after her next visit) I would like to refer  her on to GI to  look for secondary complications of GERD such as  Barrett's or stricture.   The pulmonary nodule, as long as it remains microscopic in this never-  smoker, is not necessary to pursue diagnostically.  I have explained  this to the patient and recommended followup chest x-ray today and at 3  months, and then every 6 months for 2 years.  The other option would be  to do serial CT scans which is much more expensive and a higher amount  of radiation in this very low-risk patient.     Charlaine Dalton. Sherene Sires, MD, [facility]  Electronically Signed    MBW/MedQ  DD: 03/28/2006  DT: 03/29/2006  Job #: [id number]

## 2010-09-16 NOTE — Assessment & Plan Note (Signed)
[facility]                               PULMONARY OFFICE NOTE   Jasmine Hicks, Jasmine Hicks                   MRN:          [id number]  DATE:12/04/2005                            DOB:          July 03, 1955    HISTORY:  [age]-year-old Filipino female, never-smoker, in for several  problems.  The first issue was that she had developed a chronic cough after  an apparent pneumonia with residual nodule in the right middle lobe by CT  scan.  I thought the 2 were unrelated and treated her empirically for  reflux, which totally eliminated all of her coughing, at least while she was  on the drug.  Over the last several weeks, she has noticed increasing  sensation of postnasal drip, but no itching, sneezing or wheezing or  significant sputum production, fevers, chills, sweats, orthopnea, PND or  unintended weight loss.   On physical examination, she is a pleasant ambulatory Filipino female who  does clear her throat frequently during the exam.  She has normal vital  signs, no change in her weight.  HEENT does reveal mild nonspecific  turbinate edema.  Oropharynx, however, is clear with no excessive postnasal  drainage or cobblestoning.  Neck supple without cervical adenopathy or  tenderness.  Trachea is midline.  No thyromegaly.  Lung fields are perfectly  clear bilaterally to auscultation and percussion.  There is a regular rhythm  without murmur, gallop or rub.  Abdomen soft and benign.  Extremities are  warm without calf tenderness, cyanosis, clubbing or edema.   Chest x-ray today is normal.   IMPRESSION:  1.  Her cough has relapsed off reflux medications and has no evidence of      allergic rhinitis.  Since she previously responded to Zegerid, I am      going to recommend Zegerid again for the next 30 days and if the cough      is eliminated, I have asked her to take 3 months of therapy.  If she      relapsed each time the Zegerid is stopped, I  would recommend      Gastroenterology evaluation to look for further complications of chronic      reflux such as stricture of Barrett's.  2.  The tiny nodule seen in the right middle lobe is below the radar scope      and is associated also with scarring in both the lingulae and the right      middle lobe and therefore likely benign in this patient who is a never-      smoker.  I explained this to the patient, although her it was impossible      to be 100% sure about this and to be 100%, technically she would need a      followup CT scan every 6 hours for 2 years.  I think this is      unnecessary, but certainly reasonable, given her level of anxiety      related to the abnormal CT scan.  She did agree to return in 3 months  for followup chest x-ray for apples-to-apples comparison to previous x-      rays and certainly if there is a macroscopic nodule that appears on      plain film, I would then consider directly going to excisional biopsy.      If it does not appear on plain film, it is certainly optional to do CT      scan for apples-to-apples comparison to previous CT scans, but given the      cost and radiation exposure, statistically I think it is unlikely to be      of any benefit and this is far too small to do any form of non-      excisional biopsy or PET scan for that matter.  I spent extra time with      this patient, in fact a 15- to 25-minute visit, going over all of these      issues in      detail with her including her instructions in writing.  Followup will be      therefore in 3 months with a chest x-ray, sooner if needed.                                   Charlaine Dalton. Sherene Sires, MD, [facility]   MBW/MedQ  DD:  12/04/2005  DT:  12/05/2005  Job #:  [id number]

## 2010-12-12 ENCOUNTER — Encounter: Payer: Self-pay | Admitting: Podiatry

## 2011-01-23 LAB — CK TOTAL AND CKMB (NOT AT ARMC)
CK, MB: 0.8
Total CK: 99

## 2011-01-23 LAB — COMPREHENSIVE METABOLIC PANEL
ALT: 18
AST: 17
Alkaline Phosphatase: 79
CO2: 28
Calcium: 9.5
Glucose, Bld: 121 — ABNORMAL HIGH
Potassium: 3.7
Sodium: 138
Total Protein: 7.1

## 2011-01-23 LAB — CBC
HCT: 39.2
MCV: 88
Platelets: 320
RBC: 4.46
WBC: 7.5

## 2011-04-13 LAB — HM PAP SMEAR: HM Pap smear: NEGATIVE

## 2011-07-28 LAB — HM MAMMOGRAPHY

## 2012-02-06 ENCOUNTER — Emergency Department (HOSPITAL_BASED_OUTPATIENT_CLINIC_OR_DEPARTMENT_OTHER): Payer: 59

## 2012-02-06 ENCOUNTER — Emergency Department (HOSPITAL_BASED_OUTPATIENT_CLINIC_OR_DEPARTMENT_OTHER)
Admission: EM | Admit: 2012-02-06 | Discharge: 2012-02-06 | Disposition: A | Discharge status: Home / Self Care | Payer: 59 | Attending: Emergency Medicine | Admitting: Emergency Medicine

## 2012-02-06 ENCOUNTER — Encounter (HOSPITAL_BASED_OUTPATIENT_CLINIC_OR_DEPARTMENT_OTHER): Payer: Self-pay | Admitting: *Deleted

## 2012-02-06 DIAGNOSIS — R4789 Other speech disturbances: Secondary | ICD-10-CM | POA: Insufficient documentation

## 2012-02-06 DIAGNOSIS — H81399 Other peripheral vertigo, unspecified ear: Secondary | ICD-10-CM | POA: Insufficient documentation

## 2012-02-06 DIAGNOSIS — R51 Headache: Secondary | ICD-10-CM | POA: Insufficient documentation

## 2012-02-06 HISTORY — DX: Hyperlipidemia, unspecified: E78.5

## 2012-02-06 LAB — RAPID URINE DRUG SCREEN, HOSP PERFORMED
Cocaine: NOT DETECTED
Opiates: NOT DETECTED
Tetrahydrocannabinol: NOT DETECTED

## 2012-02-06 LAB — CBC
HCT: 37.2 % (ref 36.0–46.0)
Hemoglobin: 12.6 g/dL (ref 12.0–15.0)
MCHC: 33.9 g/dL (ref 30.0–36.0)
MCV: 88.8 fL (ref 78.0–100.0)
RDW: 13.8 % (ref 11.5–15.5)

## 2012-02-06 LAB — DIFFERENTIAL
Basophils Absolute: 0 10*3/uL (ref 0.0–0.1)
Basophils Relative: 1 % (ref 0–1)
Eosinophils Relative: 1 % (ref 0–5)
Monocytes Absolute: 0.5 10*3/uL (ref 0.1–1.0)
Monocytes Relative: 7 % (ref 3–12)

## 2012-02-06 LAB — COMPREHENSIVE METABOLIC PANEL
AST: 24 U/L (ref 0–37)
Albumin: 3.9 g/dL (ref 3.5–5.2)
BUN: 13 mg/dL (ref 6–23)
CO2: 27 mEq/L (ref 19–32)
Calcium: 9 mg/dL (ref 8.4–10.5)
Creatinine, Ser: 0.7 mg/dL (ref 0.50–1.10)
GFR calc non Af Amer: >90 mL/min (ref >90)
Total Bilirubin: 0.5 mg/dL (ref 0.3–1.2)

## 2012-02-06 LAB — URINALYSIS, ROUTINE W REFLEX MICROSCOPIC
Leukocytes, UA: NEGATIVE
Nitrite: NEGATIVE
Protein, ur: NEGATIVE mg/dL
Specific Gravity, Urine: 1.009 (ref 1.005–1.030)
Urobilinogen, UA: 0.2 mg/dL (ref 0.0–1.0)

## 2012-02-06 LAB — TROPONIN I: Troponin I: <0.3 ng/mL (ref <0.30)

## 2012-02-06 MED ORDER — METHYLPREDNISOLONE SODIUM SUCC 125 MG IJ SOLR
INTRAMUSCULAR | Status: AC
  Filled 2012-02-06: qty 2

## 2012-02-06 MED ORDER — DIPHENHYDRAMINE HCL 50 MG/ML IJ SOLN
INTRAMUSCULAR | Status: AC
  Filled 2012-02-06: qty 1

## 2012-02-06 MED ORDER — DIPHENHYDRAMINE HCL 50 MG/ML IJ SOLN
50.0000 mg | Freq: Once | INTRAMUSCULAR | Status: AC
  Administered 2012-02-06: 50 mg via INTRAVENOUS

## 2012-02-06 MED ORDER — GADOBENATE DIMEGLUMINE 529 MG/ML IV SOLN
15.0000 mL | Freq: Once | INTRAVENOUS | Status: AC | PRN
  Administered 2012-02-06: 15 mL via INTRAVENOUS

## 2012-02-06 MED ORDER — METHYLPREDNISOLONE SODIUM SUCC 125 MG IJ SOLR
125.0000 mg | Freq: Once | INTRAMUSCULAR | Status: AC
  Administered 2012-02-06: 125 mg via INTRAVENOUS

## 2012-02-06 NOTE — ED Notes (Signed)
Pt coughing and developed hives after MRI contrast given, denies SOB, Ray MD made aware, new orders for benadryl 50mg  and solumedrol 125mg  given IV .  Meds given , Pt states she is much better, hives resolved along with cough, MD made aware, Nurse at bedside for completion of MRI

## 2012-02-06 NOTE — ED Notes (Signed)
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 Concerned her heart feels like is racing heart rate now is 83

## 2012-02-06 NOTE — ED Provider Notes (Signed)
History     CSN: [id number]  Arrival date & time 02/06/12  0941   First MD Initiated Contact with Patient 02/06/12 1035      Chief Complaint  Patient presents with  . high blood pressure     (Consider location/radiation/quality/duration/timing/severity/associated sxs/prior treatment) HPI  Patient states she got very hot on Saturday at festival with dizziness and states she was red and taken to [facility].  She was given iv fluids due to hr. 135 and bp 150 and sweaty.  Given two bags of fluid , ekg reported normal and symptoms better.  Sunday worked [employer] and drank plenty of fluids and felt tired but did not have problems.  Went to work yesterday and ok.  Today at work , sitting at desk, then when she got up everything spinning, the went and had bp checked and bp 130/86 and hr 92.  Then felt better and bp 130/90 and called ems and transported.  Patient states feels ok.  Left leg crampy, right chest with palpitation, no sob.  Continues to feel tired.  Vertigo lasted 40 sec without nausea, focal deficit, or difficulty walking.  Patient had headache after this and now some right sided headache.  Denies fever, chills, chest pain, abdominal pain, nausea, vomiting.  Patient had loose bowel movement here denies blood or dark.  Seen by pmd last Friday because she had stomach pain.  Stomach pain resolved.   Past Medical History  Diagnosis Date  . Hyperlipidemia     History reviewed. No pertinent past surgical history.  History reviewed. No pertinent family history.  History  Substance Use Topics  . Smoking status: Never Smoker   . Smokeless tobacco: Not on file  . Alcohol Use: No    OB History    Grav Para Term Preterm Abortions TAB SAB Ect Mult Living                  Review of Systems  Constitutional: Negative for fever, chills, activity change, appetite change and unexpected weight change.  HENT: Negative for sore throat, rhinorrhea, neck pain, neck stiffness and  sinus pressure.   Eyes: Negative for visual disturbance.  Respiratory: Negative for cough and shortness of breath.   Cardiovascular: Negative for chest pain and leg swelling.  Gastrointestinal: Negative for vomiting, abdominal pain, diarrhea and blood in stool.  Genitourinary: Negative for dysuria, urgency, frequency, vaginal discharge and difficulty urinating.  Musculoskeletal: Negative for myalgias, arthralgias and gait problem.  Skin: Negative for color change and rash.  Neurological: Negative for weakness, light-headedness and headaches.  Hematological: Does not bruise/bleed easily.  Psychiatric/Behavioral: Negative for dysphoric mood.    Allergies  Tetanus toxoids  Home Medications   Current Outpatient Rx  Name Route Sig Dispense Refill  . OMEPRAZOLE 40 MG PO CPDR Oral Take 40 mg by mouth daily.    Marland Kitchen SIMVASTATIN 40 MG PO TABS Oral Take 40 mg by mouth every evening.      BP 131/87  Pulse 85  Temp 98.4 F (36.9 C) (Oral)  Resp 20  Ht 5\' 2"  (1.575 m)  Wt 170 lb (77.111 kg)  BMI 31.09 kg/m2  SpO2 100%  Physical Exam  Nursing note and vitals reviewed. Constitutional: She is oriented to person, place, and time. She appears well-developed and well-nourished.  HENT:  Head: Normocephalic and atraumatic.  Eyes: Conjunctivae normal and EOM are normal. Pupils are equal, round, and reactive to light.  Neck: Normal range of motion. Neck supple.  Cardiovascular:  Normal rate, regular rhythm, normal heart sounds and intact distal pulses.   Pulmonary/Chest: Effort normal and breath sounds normal.  Abdominal: Soft. Bowel sounds are normal.  Musculoskeletal: Normal range of motion.  Neurological: She is alert and oriented to person, place, and time. She has normal strength and normal reflexes. No sensory deficit. She displays a negative Romberg sign. GCS eye subscore is 4. GCS verbal subscore is 5. GCS motor subscore is 6.       Patient abdomen without difficulty. She is able to stand  on her toes and her heels. She did have an episode of where finding difficulty during my exam. Remainder of her speech is normal.  Skin: Skin is warm and dry.  Psychiatric: She has a normal mood and affect. Thought content normal.    ED Course  Procedures (including critical care time)   Labs Reviewed  URINALYSIS, ROUTINE W REFLEX MICROSCOPIC   No results found.   No diagnosis found.    Date: 02/06/2012  Rate: 78  Rhythm: normal sinus rhythm  QRS Axis: normal  Intervals: normal  ST/T Wave abnormalities: normal  Conduction Disutrbances: none  Narrative Interpretation: unremarkable     MDM   Patient had episode of heat his shin on Saturday and has not felt well since. She complains today of vertigo and then had an episode of word finding difficulty during my exam. She has normal labs and normal workup including normal head CT and MRI. She did develop some rash during the MRI when she was given the contrast, however her symptoms resolved with Benadryl and Solu-Medrol. She is advised advised regarding contrast in the future. She's advised return if she has a return of the symptoms. Patient's initial symptoms appeared to start with heat exhaustion and she has continued to have some vertigo but this appears to be non-central in nature.       Hilario Quarry, MD 02/06/12 [phone]

## 2012-02-06 NOTE — ED Notes (Signed)
Patient transported to MRI 

## 2012-03-17 ENCOUNTER — Emergency Department (HOSPITAL_BASED_OUTPATIENT_CLINIC_OR_DEPARTMENT_OTHER)
Admission: EM | Admit: 2012-03-17 | Discharge: 2012-03-17 | Disposition: A | Discharge status: Home / Self Care | Payer: 59 | Attending: Emergency Medicine | Admitting: Emergency Medicine

## 2012-03-17 ENCOUNTER — Emergency Department (HOSPITAL_BASED_OUTPATIENT_CLINIC_OR_DEPARTMENT_OTHER): Payer: 59

## 2012-03-17 ENCOUNTER — Encounter (HOSPITAL_BASED_OUTPATIENT_CLINIC_OR_DEPARTMENT_OTHER): Payer: Self-pay

## 2012-03-17 DIAGNOSIS — E785 Hyperlipidemia, unspecified: Secondary | ICD-10-CM | POA: Insufficient documentation

## 2012-03-17 DIAGNOSIS — R079 Chest pain, unspecified: Secondary | ICD-10-CM | POA: Insufficient documentation

## 2012-03-17 DIAGNOSIS — K219 Gastro-esophageal reflux disease without esophagitis: Secondary | ICD-10-CM | POA: Insufficient documentation

## 2012-03-17 DIAGNOSIS — Z79899 Other long term (current) drug therapy: Secondary | ICD-10-CM | POA: Insufficient documentation

## 2012-03-17 DIAGNOSIS — R209 Unspecified disturbances of skin sensation: Secondary | ICD-10-CM | POA: Insufficient documentation

## 2012-03-17 HISTORY — DX: Gastro-esophageal reflux disease without esophagitis: K21.9

## 2012-03-17 LAB — BASIC METABOLIC PANEL
BUN: 17 mg/dL (ref 6–23)
Chloride: 104 mEq/L (ref 96–112)
Creatinine, Ser: 0.7 mg/dL (ref 0.50–1.10)
GFR calc Af Amer: >90 mL/min (ref >90)

## 2012-03-17 LAB — CBC WITH DIFFERENTIAL/PLATELET
Basophils Relative: 0 % (ref 0–1)
Eosinophils Absolute: 0.1 10*3/uL (ref 0.0–0.7)
HCT: 35.2 % — ABNORMAL LOW (ref 36.0–46.0)
Hemoglobin: 12.1 g/dL (ref 12.0–15.0)
MCH: 30.4 pg (ref 26.0–34.0)
MCHC: 34.4 g/dL (ref 30.0–36.0)
MCV: 88.4 fL (ref 78.0–100.0)
Monocytes Absolute: 0.7 10*3/uL (ref 0.1–1.0)
Monocytes Relative: 9 % (ref 3–12)
Neutro Abs: 4.3 10*3/uL (ref 1.7–7.7)

## 2012-03-17 MED ORDER — ASPIRIN 81 MG PO CHEW
324.0000 mg | CHEWABLE_TABLET | Freq: Once | ORAL | Status: AC
  Administered 2012-03-17: 324 mg via ORAL
  Filled 2012-03-17: qty 4

## 2012-03-17 MED ORDER — PANTOPRAZOLE SODIUM 40 MG IV SOLR
40.0000 mg | Freq: Once | INTRAVENOUS | Status: AC
  Administered 2012-03-17: 40 mg via INTRAVENOUS
  Filled 2012-03-17: qty 40

## 2012-03-17 NOTE — ED Notes (Signed)
MD at bedside. 

## 2012-03-17 NOTE — ED Provider Notes (Signed)
History     CSN: [id number]  Arrival date & time 03/17/12  0001   First MD Initiated Contact with Patient 03/17/12 0008      Chief Complaint  Patient presents with  . Chest Pain    Patient is a 56 y.o. female presenting with chest pain. The history is provided by the patient.  Chest Pain The chest pain began 3 - 5 hours ago. Duration of episode(s) is 1 minute. Chest pain occurs intermittently. The chest pain is unchanged. The severity of the pain is moderate. The quality of the pain is described as pressure-like. The pain does not radiate. Exacerbated by: nothing. Pertinent negatives for primary symptoms include no fever, no fatigue, no syncope, no shortness of breath, no cough, no nausea, no vomiting and no dizziness.  Pertinent negatives for associated symptoms include no diaphoresis.   Pt reports she was watching a movie with husband and she developed "tingling" in her left arm but no weakness.  She soon after developed chest pressure that lasted for a minute.  She has multiple episodes of chest pressure that resolve spontaneously - no pleuritic CP and no exertional pain.  Episodes last only around a minute  No SOB/weakness/dizziness/nausea/diaphoresis reported.  She reports she has had this CP previously with reflux but usually resolves with belching.  The CP does not radiate She also reports h/o tingling to left arm that is not new.    Family history - positive for CAD  Past Medical History  Diagnosis Date  . Hyperlipidemia   . GERD (gastroesophageal reflux disease)     History reviewed. No pertinent past surgical history.   History  Substance Use Topics  . Smoking status: Never Smoker   . Smokeless tobacco: Not on file  . Alcohol Use: No    OB History    Grav Para Term Preterm Abortions TAB SAB Ect Mult Living                  Review of Systems  Constitutional: Negative for fever, diaphoresis and fatigue.  Respiratory: Negative for cough and shortness of breath.    Cardiovascular: Positive for chest pain. Negative for syncope.  Gastrointestinal: Negative for nausea and vomiting.  Neurological: Negative for dizziness.  All other systems reviewed and are negative.    Allergies  Gadolinium derivatives and Tetanus toxoids  Home Medications   Current Outpatient Rx  Name  Route  Sig  Dispense  Refill  . OMEPRAZOLE 40 MG PO CPDR   Oral   Take 40 mg by mouth daily.         Marland Kitchen SIMVASTATIN 40 MG PO TABS   Oral   Take 40 mg by mouth every evening.           BP 129/68  Pulse 78  Temp 97.8 F (36.6 C) (Oral)  Resp 18  Wt 165 lb (74.844 kg)  SpO2 97%  Physical Exam CONSTITUTIONAL: Well developed/well nourished HEAD AND FACE: Normocephalic/atraumatic EYES: EOMI/PERRL ENMT: Mucous membranes moist NECK: supple no meningeal signs SPINE:entire spine nontender CV: S1/S2 noted, no murmurs/rubs/gallops noted LUNGS: Lungs are clear to auscultation bilaterally, no apparent distress ABDOMEN: soft, nontender, no rebound or guarding GU:no cva tenderness NEURO: Pt is awake/alert, moves all extremitiesx4, no focal motor weakness noted in her extremities.  No sensory deficit noted in her extremities EXTREMITIES: pulses normal, full ROM, no calf edema/tenderness/erythema noted SKIN: warm, color normal PSYCH: no abnormalities of mood noted    ED Course  Procedures  1:13  AM Pt denies known h/o CAD.  No h/o DVT/PE and no recent travel/surgery HEART score less than 4.  Suspicion for ACS is low.  Troponin/ekg ordered   Discussed cxr/lab findings with patient. She reports she feels improved.  She reports she wants to go home.  I advised need to check at least one more troponin at a 3 hr interval to ensure no dynamic change.  She refuses and wants to go.  Discussed strict return precautions and I advised her that MI can not be ruled out by one troponin alone.  She understands this and will f/u as outpatient  MDM  Nursing notes including past medical  history and social history reviewed and considered in documentation Previous records reviewed and considered - stress echo from 2011 negative xrays reviewed and considered Labs/vital reviewed and considered     Date: 03/17/2012  Rate: 73  Rhythm: normal sinus rhythm  QRS Axis: normal  Intervals: normal  ST/T Wave abnormalities: nonspecific ST changes  Conduction Disutrbances:none  Narrative Interpretation:   Old EKG Reviewed: unchanged from 01/2012           Joya Gaskins, MD 03/17/12 [phone]

## 2012-03-17 NOTE — ED Notes (Signed)
Patient reports that she developed left sided chestpain this evening around 6pm, reports tingling in her left arm. Denies any other associated symptoms. Patient now reports that the pain has been intermittent and thought it might be GERD that she has hx of same.

## 2012-03-17 NOTE — ED Notes (Signed)
Returned from xray

## 2012-04-28 IMAGING — CR DG CHEST 1V PORT
2 series · 2 of 2 positions shown · non-contrast
Comparison: 07/15/2007

CLINICAL DATA: Chest pain.

PORTABLE CHEST - 1 VIEW

[view not recorded (1 of 2)]
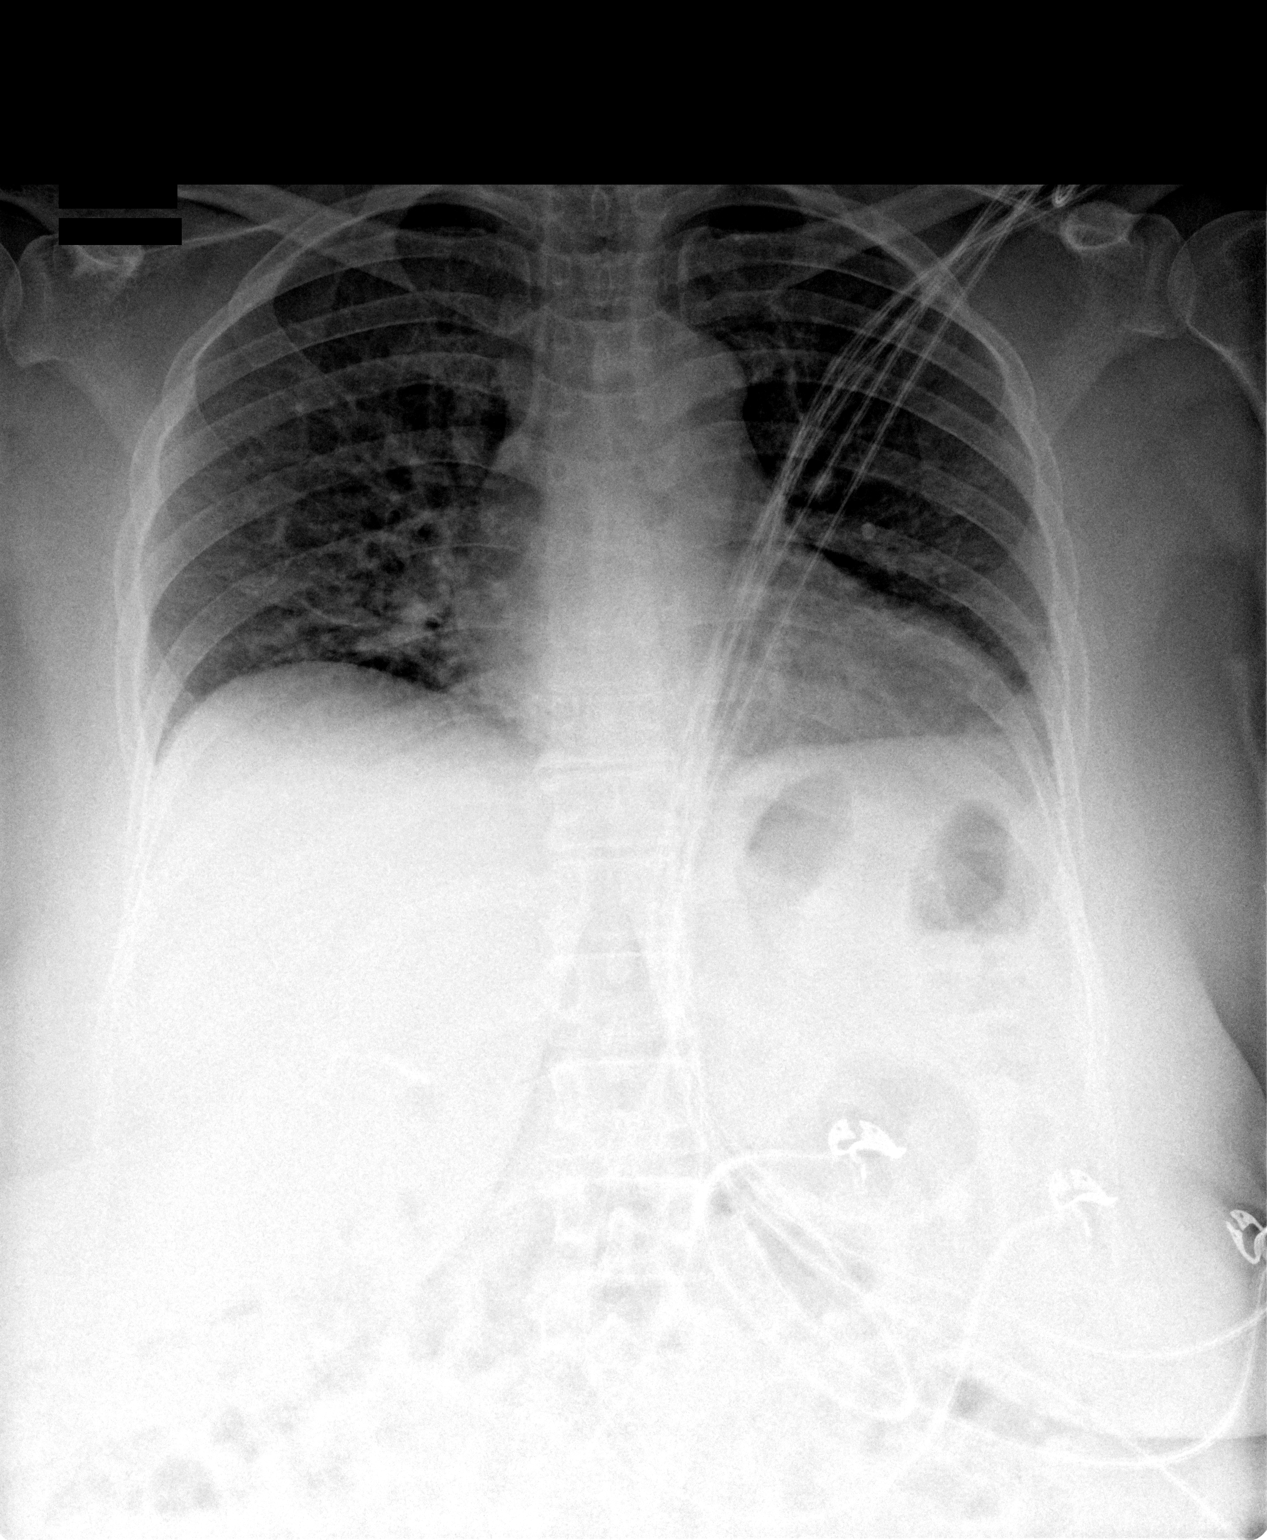

[view not recorded (2 of 2)]
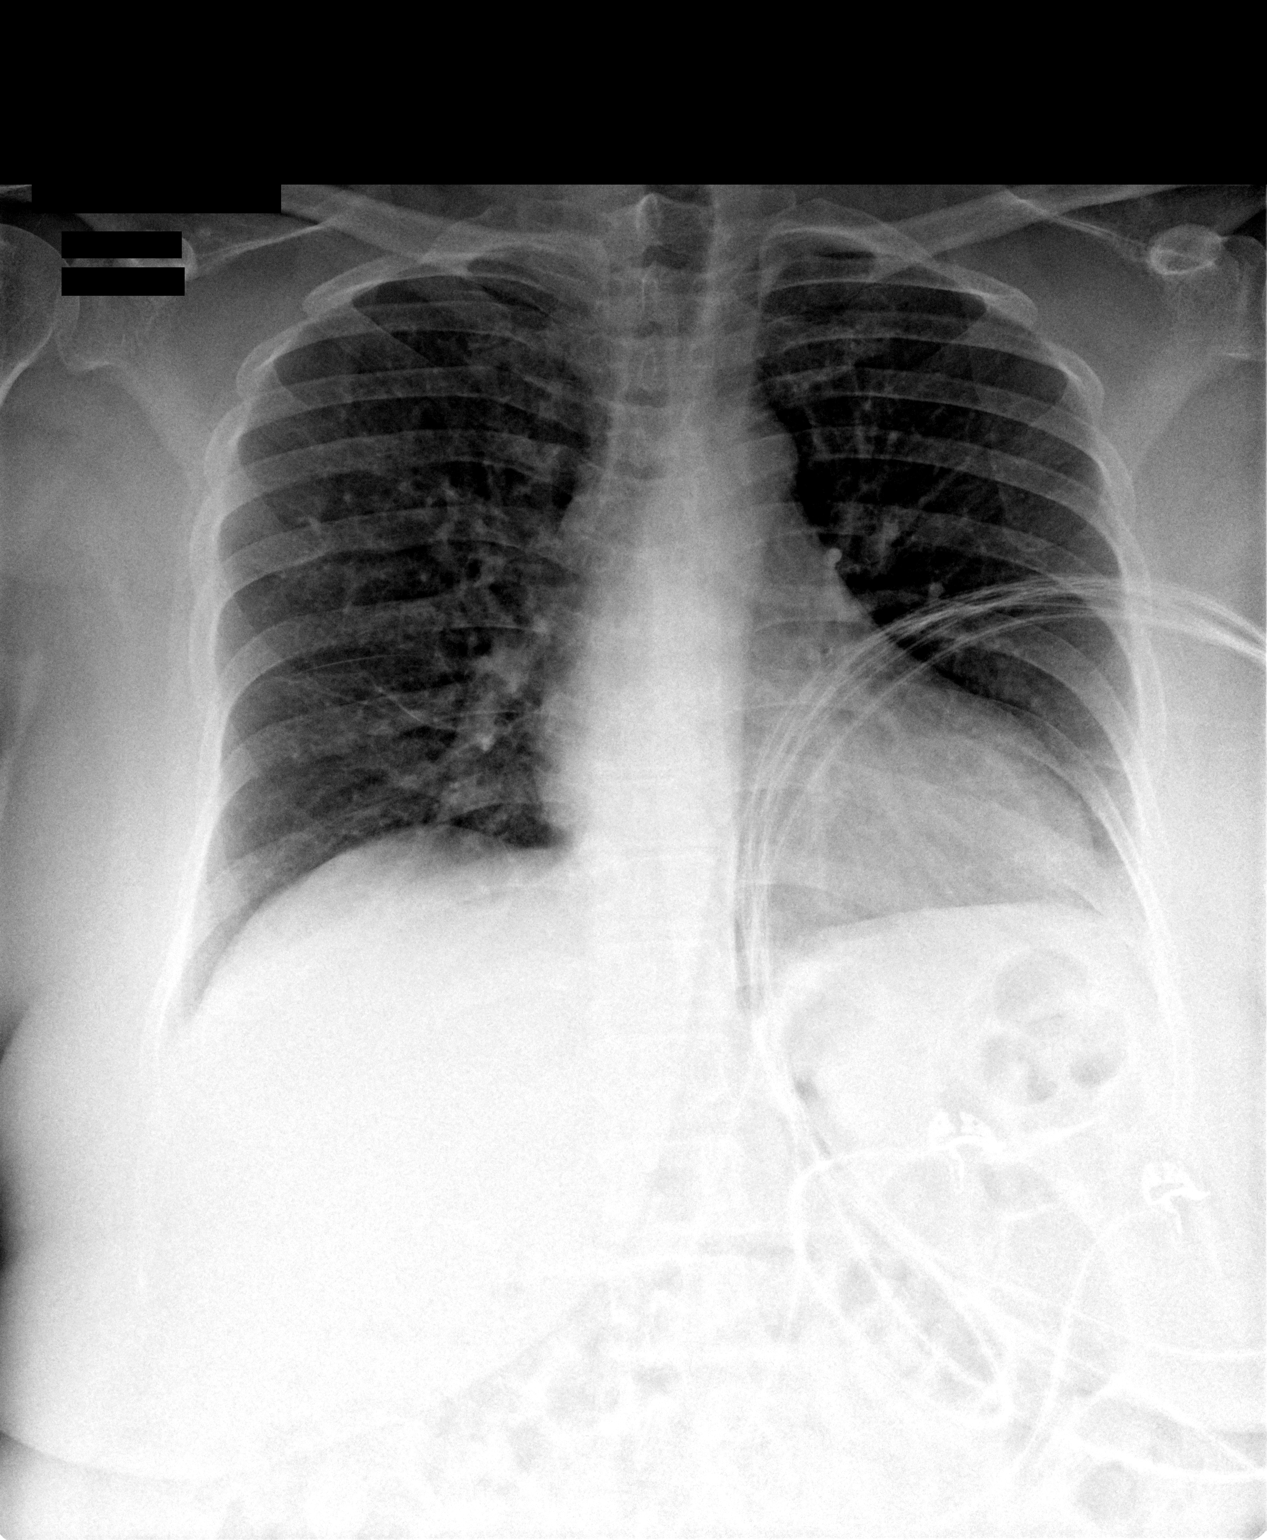

[2 of 2 positions shown; findings below may reference images not displayed]

FINDINGS: The cardiomediastinal silhouette is unremarkable.
Increased interstitial opacities are identified - question mild
interstitial edema versus interstitial infection.
There is no evidence of focal airspace disease, pleural effusion,
or pneumothorax.
No acute bony abnormalities are identified.
IMPRESSION: Interstitial opacities - question mild interstitial edema versus
interstitial infection.

## 2012-06-12 ENCOUNTER — Encounter: Payer: Self-pay | Admitting: Obstetrics & Gynecology

## 2012-07-26 ENCOUNTER — Encounter: Payer: Self-pay | Admitting: Obstetrics and Gynecology

## 2012-07-26 ENCOUNTER — Ambulatory Visit (INDEPENDENT_AMBULATORY_CARE_PROVIDER_SITE_OTHER): Payer: Managed Care, Other (non HMO) | Admitting: Obstetrics and Gynecology

## 2012-07-26 VITALS — BP 110/58 | Ht 61.5 in | Wt 181.0 lb

## 2012-07-26 DIAGNOSIS — R6889 Other general symptoms and signs: Secondary | ICD-10-CM

## 2012-07-26 DIAGNOSIS — Z0000 Encounter for general adult medical examination without abnormal findings: Secondary | ICD-10-CM

## 2012-07-26 DIAGNOSIS — Z01419 Encounter for gynecological examination (general) (routine) without abnormal findings: Secondary | ICD-10-CM

## 2012-07-26 LAB — POCT URINALYSIS DIPSTICK
Blood, UA: NEGATIVE
Glucose, UA: NEGATIVE
Urobilinogen, UA: NEGATIVE

## 2012-07-26 NOTE — Patient Instructions (Addendum)
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
## 2012-07-27 LAB — TSH: TSH: 1.863 u[IU]/mL (ref 0.350–4.500)

## 2013-07-28 ENCOUNTER — Ambulatory Visit: Payer: Managed Care, Other (non HMO) | Admitting: Obstetrics and Gynecology

## 2013-09-08 ENCOUNTER — Emergency Department (HOSPITAL_BASED_OUTPATIENT_CLINIC_OR_DEPARTMENT_OTHER): Payer: 59

## 2013-09-08 ENCOUNTER — Encounter (HOSPITAL_BASED_OUTPATIENT_CLINIC_OR_DEPARTMENT_OTHER): Payer: Self-pay | Admitting: Emergency Medicine

## 2013-09-08 ENCOUNTER — Emergency Department (HOSPITAL_BASED_OUTPATIENT_CLINIC_OR_DEPARTMENT_OTHER)
Admission: EM | Admit: 2013-09-08 | Discharge: 2013-09-08 | Disposition: A | Discharge status: Home / Self Care | Payer: 59 | Attending: Emergency Medicine | Admitting: Emergency Medicine

## 2013-09-08 DIAGNOSIS — E785 Hyperlipidemia, unspecified: Secondary | ICD-10-CM | POA: Insufficient documentation

## 2013-09-08 DIAGNOSIS — R0789 Other chest pain: Secondary | ICD-10-CM | POA: Insufficient documentation

## 2013-09-08 DIAGNOSIS — K219 Gastro-esophageal reflux disease without esophagitis: Secondary | ICD-10-CM | POA: Insufficient documentation

## 2013-09-08 DIAGNOSIS — IMO0002 Reserved for concepts with insufficient information to code with codable children: Secondary | ICD-10-CM | POA: Insufficient documentation

## 2013-09-08 DIAGNOSIS — Z8742 Personal history of other diseases of the female genital tract: Secondary | ICD-10-CM | POA: Insufficient documentation

## 2013-09-08 DIAGNOSIS — R079 Chest pain, unspecified: Secondary | ICD-10-CM

## 2013-09-08 DIAGNOSIS — F411 Generalized anxiety disorder: Secondary | ICD-10-CM | POA: Insufficient documentation

## 2013-09-08 DIAGNOSIS — Z8673 Personal history of transient ischemic attack (TIA), and cerebral infarction without residual deficits: Secondary | ICD-10-CM | POA: Insufficient documentation

## 2013-09-08 DIAGNOSIS — Z79899 Other long term (current) drug therapy: Secondary | ICD-10-CM | POA: Insufficient documentation

## 2013-09-08 LAB — CBC WITH DIFFERENTIAL/PLATELET
BASOS ABS: 0 10*3/uL (ref 0.0–0.1)
BASOS PCT: 0 % (ref 0–1)
EOS ABS: 0.1 10*3/uL (ref 0.0–0.7)
EOS PCT: 1 % (ref 0–5)
HEMATOCRIT: 35.4 % — AB (ref 36.0–46.0)
HEMOGLOBIN: 11.9 g/dL — AB (ref 12.0–15.0)
Lymphocytes Relative: 39 % (ref 12–46)
Lymphs Abs: 3.7 10*3/uL (ref 0.7–4.0)
MCH: 28.4 pg (ref 26.0–34.0)
MCHC: 33.6 g/dL (ref 30.0–36.0)
MCV: 84.5 fL (ref 78.0–100.0)
MONO ABS: 0.7 10*3/uL (ref 0.1–1.0)
MONOS PCT: 8 % (ref 3–12)
Neutro Abs: 4.9 10*3/uL (ref 1.7–7.7)
Neutrophils Relative %: 52 % (ref 43–77)
Platelets: 332 10*3/uL (ref 150–400)
RBC: 4.19 MIL/uL (ref 3.87–5.11)
RDW: 15 % (ref 11.5–15.5)
WBC: 9.5 10*3/uL (ref 4.0–10.5)

## 2013-09-08 LAB — D-DIMER, QUANTITATIVE: D-Dimer, Quant: 0.9 ug/mL-FEU — ABNORMAL HIGH (ref 0.00–0.48)

## 2013-09-08 LAB — TROPONIN I
Troponin I: <0.3 ng/mL (ref <0.30)
Troponin I: <0.3 ng/mL (ref <0.30)

## 2013-09-08 LAB — BASIC METABOLIC PANEL
BUN: 20 mg/dL (ref 6–23)
CALCIUM: 9.3 mg/dL (ref 8.4–10.5)
CO2: 24 mEq/L (ref 19–32)
CREATININE: 0.9 mg/dL (ref 0.50–1.10)
Chloride: 104 mEq/L (ref 96–112)
GFR, EST AFRICAN AMERICAN: 80 mL/min — AB (ref >90)
GFR, EST NON AFRICAN AMERICAN: 69 mL/min — AB (ref >90)
Glucose, Bld: 132 mg/dL — ABNORMAL HIGH (ref 70–99)
Potassium: 4 mEq/L (ref 3.7–5.3)
Sodium: 141 mEq/L (ref 137–147)

## 2013-09-08 MED ORDER — SODIUM CHLORIDE 0.9 % IV SOLN: INTRAVENOUS | Status: DC

## 2013-09-08 MED ORDER — ASPIRIN 81 MG PO CHEW
324.0000 mg | CHEWABLE_TABLET | Freq: Once | ORAL | Status: AC
  Administered 2013-09-08: 324 mg via ORAL
  Filled 2013-09-08: qty 4

## 2013-09-08 MED ORDER — SUCRALFATE 1 G PO TABS
1.0000 g | ORAL_TABLET | Freq: Once | ORAL | Status: AC
  Administered 2013-09-08: 1 g via ORAL
  Filled 2013-09-08: qty 1

## 2013-09-08 MED ORDER — NITROGLYCERIN 0.4 MG SL SUBL
0.4000 mg | SUBLINGUAL_TABLET | SUBLINGUAL | Status: DC | PRN
  Administered 2013-09-08: 0.4 mg via SUBLINGUAL
  Filled 2013-09-08: qty 1

## 2013-09-08 MED ORDER — GI COCKTAIL ~~LOC~~
30.0000 mL | Freq: Once | ORAL | Status: AC
  Administered 2013-09-08: 30 mL via ORAL
  Filled 2013-09-08: qty 30

## 2013-09-08 MED ORDER — IOHEXOL 350 MG/ML SOLN
100.0000 mL | Freq: Once | INTRAVENOUS | Status: AC | PRN
  Administered 2013-09-08: 100 mL via INTRAVENOUS

## 2013-09-08 NOTE — Discharge Instructions (Signed)
Chest Pain (Nonspecific) °It is often hard to give a specific diagnosis for the cause of chest pain. There is always a chance that your pain could be related to something serious, such as a heart attack or a blood clot in the lungs. You need to follow up with your caregiver for further evaluation. °CAUSES  °· Heartburn. °· Pneumonia or bronchitis. °· Anxiety or stress. °· Inflammation around your heart (pericarditis) or lung (pleuritis or pleurisy). °· A blood clot in the lung. °· A collapsed lung (pneumothorax). It can develop suddenly on its own (spontaneous pneumothorax) or from injury (trauma) to the chest. °· Shingles infection (herpes zoster virus). °The chest wall is composed of bones, muscles, and cartilage. Any of these can be the source of the pain. °· The bones can be bruised by injury. °· The muscles or cartilage can be strained by coughing or overwork. °· The cartilage can be affected by inflammation and become sore (costochondritis). °DIAGNOSIS  °Lab tests or other studies, such as X-rays, electrocardiography, stress testing, or cardiac imaging, may be needed to find the cause of your pain.  °TREATMENT  °· Treatment depends on what may be causing your chest pain. Treatment may include: °· Acid blockers for heartburn. °· Anti-inflammatory medicine. °· Pain medicine for inflammatory conditions. °· Antibiotics if an infection is present. °· You may be advised to change lifestyle habits. This includes stopping smoking and avoiding alcohol, caffeine, and chocolate. °· You may be advised to keep your head raised (elevated) when sleeping. This reduces the chance of acid going backward from your stomach into your esophagus. °· Most of the time, nonspecific chest pain will improve within 2 to 3 days with rest and mild pain medicine. °HOME CARE INSTRUCTIONS  °· If antibiotics were prescribed, take your antibiotics as directed. Finish them even if you start to feel better. °· For the next few days, avoid physical  activities that bring on chest pain. Continue physical activities as directed. °· Do not smoke. °· Avoid drinking alcohol. °· Only take over-the-counter or prescription medicine for pain, discomfort, or fever as directed by your caregiver. °· Follow your caregiver's suggestions for further testing if your chest pain does not go away. °· Keep any follow-up appointments you made. If you do not go to an appointment, you could develop lasting (chronic) problems with pain. If there is any problem keeping an appointment, you must call to reschedule. °SEEK MEDICAL CARE IF:  °· You think you are having problems from the medicine you are taking. Read your medicine instructions carefully. °· Your chest pain does not go away, even after treatment. °· You develop a rash with blisters on your chest. °SEEK IMMEDIATE MEDICAL CARE IF:  °· You have increased chest pain or pain that spreads to your arm, neck, jaw, back, or abdomen. °· You develop shortness of breath, an increasing cough, or you are coughing up blood. °· You have severe back or abdominal pain, feel nauseous, or vomit. °· You develop severe weakness, fainting, or chills. °· You have a fever. °THIS IS AN EMERGENCY. Do not wait to see if the pain will go away. Get medical help at once. Call your local emergency services (911 in [place] Do not drive yourself to the hospital. °MAKE SURE YOU:  °· Understand these instructions. °· Will watch your condition. °· Will get help right away if you are not doing well or get worse. °Document Released: 01/25/2005 Document Revised: 07/10/2011 Document Reviewed: 11/21/2007 °Exit[employer]® Patient Information ©2014 [employer]   [employer]. ° °

## 2013-09-08 NOTE — ED Notes (Addendum)
Return from CT. Denies any chest pain at present.
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
## 2013-09-08 NOTE — ED Notes (Signed)
Transported to CT 

## 2013-09-08 NOTE — ED Notes (Signed)
Pt reports chest pain/pressure to center of chest that started one hour ago - reports pain radiates into left arm, reports indigestion, states ate fish and rice around 1800 5/10.

## 2013-09-08 NOTE — ED Notes (Signed)
Pt reports feeling "funny"in her jaw after first nitro given- sx resolved but refused additional dose. EDP made aware-

## 2013-09-29 ENCOUNTER — Ambulatory Visit: Payer: Managed Care, Other (non HMO) | Admitting: Obstetrics and Gynecology

## 2013-09-29 ENCOUNTER — Encounter: Payer: Self-pay | Admitting: Obstetrics and Gynecology

## 2013-10-03 DIAGNOSIS — K219 Gastro-esophageal reflux disease without esophagitis: Secondary | ICD-10-CM | POA: Diagnosis present

## 2013-10-03 DIAGNOSIS — J019 Acute sinusitis, unspecified: Secondary | ICD-10-CM | POA: Insufficient documentation

## 2013-10-03 DIAGNOSIS — D649 Anemia, unspecified: Secondary | ICD-10-CM | POA: Insufficient documentation

## 2013-10-03 DIAGNOSIS — J302 Other seasonal allergic rhinitis: Secondary | ICD-10-CM | POA: Insufficient documentation

## 2013-10-29 ENCOUNTER — Encounter: Payer: Self-pay | Admitting: Obstetrics and Gynecology

## 2013-10-29 ENCOUNTER — Ambulatory Visit (INDEPENDENT_AMBULATORY_CARE_PROVIDER_SITE_OTHER): Payer: 59 | Admitting: Obstetrics and Gynecology

## 2013-10-29 VITALS — BP 110/66 | HR 70 | Resp 14 | Ht 61.5 in | Wt 187.4 lb

## 2013-10-29 DIAGNOSIS — Z01419 Encounter for gynecological examination (general) (routine) without abnormal findings: Secondary | ICD-10-CM

## 2013-10-29 DIAGNOSIS — Z0000 Encounter for general adult medical examination without abnormal findings: Secondary | ICD-10-CM

## 2013-10-29 LAB — POCT URINALYSIS DIPSTICK
Bilirubin, UA: NEGATIVE
Glucose, UA: NEGATIVE
KETONES UA: NEGATIVE
Leukocytes, UA: NEGATIVE
Nitrite, UA: NEGATIVE
PH UA: 5
PROTEIN UA: NEGATIVE
RBC UA: NEGATIVE
UROBILINOGEN UA: NEGATIVE

## 2013-10-29 NOTE — Patient Instructions (Signed)
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
## 2013-10-31 ENCOUNTER — Emergency Department (HOSPITAL_BASED_OUTPATIENT_CLINIC_OR_DEPARTMENT_OTHER): Payer: 59

## 2013-10-31 ENCOUNTER — Encounter (HOSPITAL_BASED_OUTPATIENT_CLINIC_OR_DEPARTMENT_OTHER): Payer: Self-pay | Admitting: Emergency Medicine

## 2013-10-31 ENCOUNTER — Emergency Department (HOSPITAL_BASED_OUTPATIENT_CLINIC_OR_DEPARTMENT_OTHER)
Admission: EM | Admit: 2013-10-31 | Discharge: 2013-11-01 | Disposition: A | Discharge status: Home / Self Care | Payer: 59 | Attending: Emergency Medicine | Admitting: Emergency Medicine

## 2013-10-31 DIAGNOSIS — Y929 Unspecified place or not applicable: Secondary | ICD-10-CM | POA: Insufficient documentation

## 2013-10-31 DIAGNOSIS — Z79899 Other long term (current) drug therapy: Secondary | ICD-10-CM | POA: Insufficient documentation

## 2013-10-31 DIAGNOSIS — X58XXXA Exposure to other specified factors, initial encounter: Secondary | ICD-10-CM | POA: Insufficient documentation

## 2013-10-31 DIAGNOSIS — IMO0002 Reserved for concepts with insufficient information to code with codable children: Secondary | ICD-10-CM | POA: Insufficient documentation

## 2013-10-31 DIAGNOSIS — E785 Hyperlipidemia, unspecified: Secondary | ICD-10-CM | POA: Insufficient documentation

## 2013-10-31 DIAGNOSIS — Z8742 Personal history of other diseases of the female genital tract: Secondary | ICD-10-CM | POA: Insufficient documentation

## 2013-10-31 DIAGNOSIS — S29011A Strain of muscle and tendon of front wall of thorax, initial encounter: Secondary | ICD-10-CM

## 2013-10-31 DIAGNOSIS — J069 Acute upper respiratory infection, unspecified: Secondary | ICD-10-CM | POA: Insufficient documentation

## 2013-10-31 DIAGNOSIS — R059 Cough, unspecified: Secondary | ICD-10-CM | POA: Insufficient documentation

## 2013-10-31 DIAGNOSIS — J029 Acute pharyngitis, unspecified: Secondary | ICD-10-CM | POA: Insufficient documentation

## 2013-10-31 DIAGNOSIS — K219 Gastro-esophageal reflux disease without esophagitis: Secondary | ICD-10-CM | POA: Insufficient documentation

## 2013-10-31 DIAGNOSIS — Y9389 Activity, other specified: Secondary | ICD-10-CM | POA: Insufficient documentation

## 2013-10-31 DIAGNOSIS — Z8673 Personal history of transient ischemic attack (TIA), and cerebral infarction without residual deficits: Secondary | ICD-10-CM | POA: Insufficient documentation

## 2013-10-31 DIAGNOSIS — Z792 Long term (current) use of antibiotics: Secondary | ICD-10-CM | POA: Insufficient documentation

## 2013-10-31 DIAGNOSIS — F411 Generalized anxiety disorder: Secondary | ICD-10-CM | POA: Insufficient documentation

## 2013-10-31 DIAGNOSIS — R05 Cough: Secondary | ICD-10-CM | POA: Insufficient documentation

## 2013-10-31 MED ORDER — KETOROLAC TROMETHAMINE 60 MG/2ML IM SOLN
60.0000 mg | Freq: Once | INTRAMUSCULAR | Status: AC
  Administered 2013-11-01: 60 mg via INTRAMUSCULAR
  Filled 2013-10-31: qty 2

## 2013-10-31 MED ORDER — HYDROCODONE-HOMATROPINE 5-1.5 MG/5ML PO SYRP
5.0000 mL | ORAL_SOLUTION | Freq: Once | ORAL | Status: DC
  Filled 2013-10-31: qty 5

## 2013-10-31 MED ORDER — METHOCARBAMOL 500 MG PO TABS
500.0000 mg | ORAL_TABLET | Freq: Once | ORAL | Status: AC
  Administered 2013-11-01: 500 mg via ORAL
  Filled 2013-10-31: qty 1

## 2013-10-31 NOTE — ED Notes (Signed)
C/o cough, congestion, sob since Monday  Has had inhaler breathing tx ,cough meds and z pak  Not getting any better

## 2013-10-31 NOTE — ED Notes (Signed)
Pt reports that Monday she was swabbed for strep which was negative
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
## 2013-10-31 NOTE — ED Notes (Signed)
Pt reports that she has had upper airway infection and was seen at pcp yesterday, given cortisone injection and inhaler. This am she developed pressure to left side of chest, pt describes this chest pain as the same she has felt in the past, + sob, + nausea, + cough

## 2013-11-01 MED ORDER — LORAZEPAM 0.5 MG PO TABS: 0.5000 mg | ORAL_TABLET | Freq: Four times a day (QID) | ORAL | Status: DC | PRN

## 2013-11-01 MED ORDER — HYDROCODONE-ACETAMINOPHEN 5-325 MG PO TABS
ORAL_TABLET | ORAL | Status: AC
  Filled 2013-11-01: qty 1

## 2013-11-01 MED ORDER — LORAZEPAM 1 MG PO TABS
0.5000 mg | ORAL_TABLET | Freq: Once | ORAL | Status: AC
  Administered 2013-11-01: 0.5 mg via ORAL
  Filled 2013-11-01: qty 1

## 2013-11-01 MED ORDER — IBUPROFEN 600 MG PO TABS: 600.0000 mg | ORAL_TABLET | Freq: Four times a day (QID) | ORAL | Status: DC | PRN

## 2013-11-01 MED ORDER — HYDROCODONE-ACETAMINOPHEN 5-325 MG PO TABS
1.0000 | ORAL_TABLET | Freq: Once | ORAL | Status: AC
  Administered 2013-11-01: 1 via ORAL

## 2013-11-01 MED ORDER — METHOCARBAMOL 500 MG PO TABS: 500.0000 mg | ORAL_TABLET | Freq: Two times a day (BID) | ORAL | Status: DC

## 2013-11-01 NOTE — Discharge Instructions (Signed)
Upper Respiratory Infection, Adult °An upper respiratory infection (URI) is also sometimes known as the common cold. The upper respiratory tract includes the nose, sinuses, throat, trachea, and bronchi. Bronchi are the airways leading to the lungs. Most people improve within 1 week, but symptoms can last up to 2 weeks. A residual cough may last even longer.  °CAUSES °Many different viruses can infect the tissues lining the upper respiratory tract. The tissues become irritated and inflamed and often become very moist. Mucus production is also common. A cold is contagious. You can easily spread the virus to others by oral contact. This includes kissing, sharing a glass, coughing, or sneezing. Touching your mouth or nose and then touching a surface, which is then touched by another person, can also spread the virus. °SYMPTOMS  °Symptoms typically develop 1 to 3 days after you come in contact with a cold virus. Symptoms vary from person to person. They may include: °· Runny nose. °· Sneezing. °· Nasal congestion. °· Sinus irritation. °· Sore throat. °· Loss of voice (laryngitis). °· Cough. °· Fatigue. °· Muscle aches. °· Loss of appetite. °· Headache. °· Low-grade fever. °DIAGNOSIS  °You might diagnose your own cold based on familiar symptoms, since most people get a cold 2 to 3 times a year. Your caregiver can confirm this based on your exam. Most importantly, your caregiver can check that your symptoms are not due to another disease such as strep throat, sinusitis, pneumonia, asthma, or epiglottitis. Blood tests, throat tests, and X-rays are not necessary to diagnose a common cold, but they may sometimes be helpful in excluding other more serious diseases. Your caregiver will decide if any further tests are required. °RISKS AND COMPLICATIONS  °You may be at risk for a more severe case of the common cold if you smoke cigarettes, have chronic heart disease (such as heart failure) or lung disease (such as asthma), or if  you have a weakened immune system. The very young and very old are also at risk for more serious infections. Bacterial sinusitis, middle ear infections, and bacterial pneumonia can complicate the common cold. The common cold can worsen asthma and chronic obstructive pulmonary disease (COPD). Sometimes, these complications can require emergency medical care and may be life-threatening. °PREVENTION  °The best way to protect against getting a cold is to practice good hygiene. Avoid oral or hand contact with people with cold symptoms. Wash your hands often if contact occurs. There is no clear evidence that vitamin C, vitamin E, echinacea, or exercise reduces the chance of developing a cold. However, it is always recommended to get plenty of rest and practice good nutrition. °TREATMENT  °Treatment is directed at relieving symptoms. There is no cure. Antibiotics are not effective, because the infection is caused by a virus, not by bacteria. Treatment may include: °· Increased fluid intake. Sports drinks offer valuable electrolytes, sugars, and fluids. °· Breathing heated mist or steam (vaporizer or shower). °· Eating chicken soup or other clear broths, and maintaining good nutrition. °· Getting plenty of rest. °· Using gargles or lozenges for comfort. °· Controlling fevers with ibuprofen or acetaminophen as directed by your caregiver. °· Increasing usage of your inhaler if you have asthma. °Zinc gel and zinc lozenges, taken in the first 24 hours of the common cold, can shorten the duration and lessen the severity of symptoms. Pain medicines may help with fever, muscle aches, and throat pain. A variety of non-prescription medicines are available to treat congestion and runny nose. Your caregiver   can make recommendations and may suggest nasal or lung inhalers for other symptoms.  HOME CARE INSTRUCTIONS   Only take over-the-counter or prescription medicines for pain, discomfort, or fever as directed by your  caregiver.  Use a warm mist humidifier or inhale steam from a shower to increase air moisture. This may keep secretions moist and make it easier to breathe.  Drink enough water and fluids to keep your urine clear or pale yellow.  Rest as needed.  Return to work when your temperature has returned to normal or as your caregiver advises. You may need to stay home longer to avoid infecting others. You can also use a face mask and careful hand washing to prevent spread of the virus. SEEK MEDICAL CARE IF:   After the first few days, you feel you are getting worse rather than better.  You need your caregiver's advice about medicines to control symptoms.  You develop chills, worsening shortness of breath, or brown or red sputum. These may be signs of pneumonia.  You develop yellow or brown nasal discharge or pain in the face, especially when you bend forward. These may be signs of sinusitis.  You develop a fever, swollen neck glands, pain with swallowing, or white areas in the back of your throat. These may be signs of strep throat. SEEK IMMEDIATE MEDICAL CARE IF:   You have a fever.  You develop severe or persistent headache, ear pain, sinus pain, or chest pain.  You develop wheezing, a prolonged cough, cough up blood, or have a change in your usual mucus (if you have chronic lung disease).  You develop sore muscles or a stiff neck. Document Released: 10/11/2000 Document Revised: 07/10/2011 Document Reviewed: 08/19/2010 [facility] Patient Information 2015 [place], [place]. This information is not intended to replace advice given to you by your health care provider. Make sure you discuss any questions you have with your health care provider.  Muscle Strain A muscle strain is an injury that occurs when a muscle is stretched beyond its normal length. Usually a small number of muscle fibers are torn when this happens. Muscle strain is rated in degrees. First-degree strains have the least amount of  muscle fiber tearing and pain. Second-degree and third-degree strains have increasingly more tearing and pain.  Usually, recovery from muscle strain takes 1-2 weeks. Complete healing takes 5-6 weeks.  CAUSES  Muscle strain happens when a sudden, violent force placed on a muscle stretches it too far. This may occur with lifting, sports, or a fall.  RISK FACTORS Muscle strain is especially common in athletes.  SIGNS AND SYMPTOMS At the site of the muscle strain, there may be:  Pain.  Bruising.  Swelling.  Difficulty using the muscle due to pain or lack of normal function. DIAGNOSIS  Your health care provider will perform a physical exam and ask about your medical history. TREATMENT  Often, the best treatment for a muscle strain is resting, icing, and applying cold compresses to the injured area.  HOME CARE INSTRUCTIONS   Use the PRICE method of treatment to promote muscle healing during the first 2-3 days after your injury. The PRICE method involves:  Protecting the muscle from being injured again.  Restricting your activity and resting the injured body part.  Icing your injury. To do this, put ice in a plastic bag. Place a towel between your skin and the bag. Then, apply the ice and leave it on from 15-20 minutes each hour. After the third day, switch to moist  heat packs.  Apply compression to the injured area with a splint or elastic bandage. Be careful not to wrap it too tightly. This may interfere with blood circulation or increase swelling.  Elevate the injured body part above the level of your heart as often as you can.  Only take over-the-counter or prescription medicines for pain, discomfort, or fever as directed by your health care provider.  Warming up prior to exercise helps to prevent future muscle strains. SEEK MEDICAL CARE IF:   You have increasing pain or swelling in the injured area.  You have numbness, tingling, or a significant loss of strength in the injured  area. MAKE SURE YOU:   Understand these instructions.  Will watch your condition.  Will get help right away if you are not doing well or get worse. Document Released: 04/17/2005 Document Revised: 02/05/2013 Document Reviewed: 11/14/2012 [facility] Patient Information 2015 [place], [place]. This information is not intended to replace advice given to you by your health care provider. Make sure you discuss any questions you have with your health care provider.

## 2013-11-01 NOTE — ED Notes (Signed)
Pt sleeping at present

## 2013-11-01 NOTE — ED Notes (Signed)
Pt c/o tightening in abd and chest that comes  Rapid breathing

## 2013-11-04 LAB — IPS PAP TEST WITH HPV

## 2014-03-02 ENCOUNTER — Encounter (HOSPITAL_BASED_OUTPATIENT_CLINIC_OR_DEPARTMENT_OTHER): Payer: Self-pay | Admitting: Emergency Medicine

## 2014-03-31 ENCOUNTER — Telehealth: Payer: Self-pay | Admitting: Neurology

## 2014-03-31 NOTE — Telephone Encounter (Signed)
PT CALLED AND CANCELED NEW PATIENT AND DID NOT RESCH WILL CALL BACK TO [facility] NOTIFIED REFERRING DR AS WELL

## 2014-04-14 ENCOUNTER — Ambulatory Visit: Payer: 59 | Admitting: Neurology

## 2014-10-29 ENCOUNTER — Ambulatory Visit
Admission: RE | Admit: 2014-10-29 | Discharge: 2014-10-29 | Disposition: A | Discharge status: Home / Self Care | Payer: 59 | Source: Ambulatory Visit | Attending: Family Medicine | Admitting: Family Medicine

## 2014-10-29 ENCOUNTER — Other Ambulatory Visit: Payer: Self-pay | Admitting: Family Medicine

## 2014-10-29 DIAGNOSIS — M25572 Pain in left ankle and joints of left foot: Secondary | ICD-10-CM

## 2015-04-07 ENCOUNTER — Ambulatory Visit (INDEPENDENT_AMBULATORY_CARE_PROVIDER_SITE_OTHER): Payer: 59 | Admitting: Obstetrics and Gynecology

## 2015-04-07 ENCOUNTER — Encounter: Payer: Self-pay | Admitting: Obstetrics and Gynecology

## 2015-04-07 VITALS — BP 110/66 | HR 84 | Resp 18 | Ht 61.0 in | Wt 191.0 lb

## 2015-04-07 DIAGNOSIS — Z01419 Encounter for gynecological examination (general) (routine) without abnormal findings: Secondary | ICD-10-CM | POA: Diagnosis not present

## 2015-04-07 DIAGNOSIS — N760 Acute vaginitis: Secondary | ICD-10-CM | POA: Diagnosis not present

## 2015-04-07 DIAGNOSIS — Z0000 Encounter for general adult medical examination without abnormal findings: Secondary | ICD-10-CM

## 2015-04-07 LAB — POCT URINALYSIS DIPSTICK
Bilirubin, UA: NEGATIVE
Blood, UA: NEGATIVE
GLUCOSE UA: NEGATIVE
KETONES UA: NEGATIVE
Leukocytes, UA: NEGATIVE
Nitrite, UA: NEGATIVE
PROTEIN UA: NEGATIVE
Urobilinogen, UA: NEGATIVE
pH, UA: 5
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
## 2015-04-07 NOTE — Patient Instructions (Signed)

## 2015-04-08 ENCOUNTER — Telehealth: Payer: Self-pay

## 2015-04-08 LAB — WET PREP BY MOLECULAR PROBE
Candida species: NEGATIVE
Gardnerella vaginalis: NEGATIVE
Trichomonas vaginosis: NEGATIVE

## 2015-04-08 NOTE — Telephone Encounter (Signed)
Spoke with patient. Results given as seen below from Dr.Silva. Patient is agreeable and verbalizes understanding.  Routing to provider for final review. Patient agreeable to disposition. Will close encounter.    

## 2015-04-08 NOTE — Telephone Encounter (Signed)
Left message to call Kaitlyn at [phone]. 

## 2015-04-08 NOTE — Telephone Encounter (Signed)
-----   Message from Patton SallesBrook E Amundson C Silva, MD sent at 04/08/2015  6:03 AM EST ----- Please inform patient of negative Affirm testing.   Cc - Claudette LawsAmanda Dixon

## 2015-08-10 ENCOUNTER — Emergency Department (HOSPITAL_COMMUNITY)
Admission: EM | Admit: 2015-08-10 | Discharge: 2015-08-10 | Disposition: A | Discharge status: Home / Self Care | Payer: 59 | Attending: Emergency Medicine | Admitting: Emergency Medicine

## 2015-08-10 ENCOUNTER — Encounter (HOSPITAL_COMMUNITY): Payer: Self-pay

## 2015-08-10 DIAGNOSIS — E860 Dehydration: Secondary | ICD-10-CM | POA: Insufficient documentation

## 2015-08-10 DIAGNOSIS — K219 Gastro-esophageal reflux disease without esophagitis: Secondary | ICD-10-CM | POA: Insufficient documentation

## 2015-08-10 DIAGNOSIS — I10 Essential (primary) hypertension: Secondary | ICD-10-CM | POA: Diagnosis not present

## 2015-08-10 DIAGNOSIS — E785 Hyperlipidemia, unspecified: Secondary | ICD-10-CM | POA: Insufficient documentation

## 2015-08-10 DIAGNOSIS — I951 Orthostatic hypotension: Secondary | ICD-10-CM | POA: Insufficient documentation

## 2015-08-10 DIAGNOSIS — Z8742 Personal history of other diseases of the female genital tract: Secondary | ICD-10-CM | POA: Insufficient documentation

## 2015-08-10 DIAGNOSIS — D649 Anemia, unspecified: Secondary | ICD-10-CM | POA: Insufficient documentation

## 2015-08-10 DIAGNOSIS — R55 Syncope and collapse: Secondary | ICD-10-CM | POA: Diagnosis present

## 2015-08-10 DIAGNOSIS — Z8673 Personal history of transient ischemic attack (TIA), and cerebral infarction without residual deficits: Secondary | ICD-10-CM | POA: Diagnosis not present

## 2015-08-10 DIAGNOSIS — Z79899 Other long term (current) drug therapy: Secondary | ICD-10-CM | POA: Diagnosis not present

## 2015-08-10 DIAGNOSIS — Z8659 Personal history of other mental and behavioral disorders: Secondary | ICD-10-CM | POA: Insufficient documentation

## 2015-08-10 HISTORY — DX: Anemia, unspecified: D64.9

## 2015-08-10 HISTORY — DX: Unspecified hemorrhoids: K64.9

## 2015-08-10 LAB — CBC
HEMATOCRIT: 40.9 % (ref 36.0–46.0)
Hemoglobin: 13.6 g/dL (ref 12.0–15.0)
MCH: 30.1 pg (ref 26.0–34.0)
MCHC: 33.3 g/dL (ref 30.0–36.0)
MCV: 90.5 fL (ref 78.0–100.0)
PLATELETS: 347 10*3/uL (ref 150–400)
RBC: 4.52 MIL/uL (ref 3.87–5.11)
RDW: 13.2 % (ref 11.5–15.5)
WBC: 7.6 10*3/uL (ref 4.0–10.5)

## 2015-08-10 LAB — BASIC METABOLIC PANEL
ANION GAP: 10 (ref 5–15)
BUN: 20 mg/dL (ref 6–20)
CALCIUM: 9.4 mg/dL (ref 8.9–10.3)
CO2: 25 mmol/L (ref 22–32)
CREATININE: 0.78 mg/dL (ref 0.44–1.00)
Chloride: 108 mmol/L (ref 101–111)
GFR calc non Af Amer: >60 mL/min (ref >60)
Glucose, Bld: 116 mg/dL — ABNORMAL HIGH (ref 65–99)
Potassium: 4.1 mmol/L (ref 3.5–5.1)
SODIUM: 143 mmol/L (ref 135–145)

## 2015-08-10 MED ORDER — SODIUM CHLORIDE 0.9 % IV SOLN
1000.0000 mL | INTRAVENOUS | Status: DC
  Administered 2015-08-10: 1000 mL via INTRAVENOUS

## 2015-08-10 MED ORDER — SODIUM CHLORIDE 0.9 % IV SOLN
1000.0000 mL | Freq: Once | INTRAVENOUS | Status: AC
  Administered 2015-08-10: 1000 mL via INTRAVENOUS

## 2015-08-10 NOTE — ED Notes (Signed)
Per EMS- patient was at work and had a near syncopal episode. Co-workers lowered her to the floor and patient felt better, EKG-NSR. Patient reported a history of anemia and hypertension.

## 2015-08-10 NOTE — ED Notes (Signed)
Bed: [id number] Expected date:  Expected time:  Means of arrival:  Comments: EMS- 59yo F, dizziness/near syncope
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
## 2015-08-10 NOTE — ED Notes (Signed)
Patient ambulated to the bathroom and reported no dizziness.

## 2015-08-10 NOTE — Discharge Instructions (Signed)

## 2015-08-11 ENCOUNTER — Observation Stay (HOSPITAL_COMMUNITY)
Admission: EM | Admit: 2015-08-11 | Discharge: 2015-08-12 | Disposition: A | Discharge status: Home / Self Care | Payer: 59 | Attending: Internal Medicine | Admitting: Internal Medicine

## 2015-08-11 ENCOUNTER — Emergency Department (HOSPITAL_COMMUNITY): Payer: 59

## 2015-08-11 ENCOUNTER — Encounter (HOSPITAL_COMMUNITY): Payer: Self-pay | Admitting: Emergency Medicine

## 2015-08-11 DIAGNOSIS — I959 Hypotension, unspecified: Secondary | ICD-10-CM | POA: Diagnosis present

## 2015-08-11 DIAGNOSIS — R0789 Other chest pain: Secondary | ICD-10-CM | POA: Diagnosis present

## 2015-08-11 DIAGNOSIS — F419 Anxiety disorder, unspecified: Secondary | ICD-10-CM | POA: Diagnosis present

## 2015-08-11 DIAGNOSIS — R531 Weakness: Secondary | ICD-10-CM

## 2015-08-11 DIAGNOSIS — R079 Chest pain, unspecified: Secondary | ICD-10-CM

## 2015-08-11 DIAGNOSIS — Z7982 Long term (current) use of aspirin: Secondary | ICD-10-CM | POA: Diagnosis not present

## 2015-08-11 DIAGNOSIS — Z79899 Other long term (current) drug therapy: Secondary | ICD-10-CM | POA: Insufficient documentation

## 2015-08-11 DIAGNOSIS — K219 Gastro-esophageal reflux disease without esophagitis: Secondary | ICD-10-CM | POA: Insufficient documentation

## 2015-08-11 DIAGNOSIS — G459 Transient cerebral ischemic attack, unspecified: Secondary | ICD-10-CM | POA: Diagnosis present

## 2015-08-11 DIAGNOSIS — R5383 Other fatigue: Secondary | ICD-10-CM | POA: Insufficient documentation

## 2015-08-11 DIAGNOSIS — R42 Dizziness and giddiness: Secondary | ICD-10-CM | POA: Insufficient documentation

## 2015-08-11 DIAGNOSIS — E785 Hyperlipidemia, unspecified: Secondary | ICD-10-CM | POA: Diagnosis present

## 2015-08-11 DIAGNOSIS — R52 Pain, unspecified: Secondary | ICD-10-CM

## 2015-08-11 LAB — D-DIMER, QUANTITATIVE: D-Dimer, Quant: <0.27 ug/mL-FEU (ref 0.00–0.50)

## 2015-08-11 LAB — BASIC METABOLIC PANEL
Anion gap: 8 (ref 5–15)
BUN: 14 mg/dL (ref 6–20)
CHLORIDE: 107 mmol/L (ref 101–111)
CO2: 24 mmol/L (ref 22–32)
CREATININE: 0.72 mg/dL (ref 0.44–1.00)
Calcium: 8.6 mg/dL — ABNORMAL LOW (ref 8.9–10.3)
GFR calc non Af Amer: >60 mL/min (ref >60)
Glucose, Bld: 169 mg/dL — ABNORMAL HIGH (ref 65–99)
POTASSIUM: 3.8 mmol/L (ref 3.5–5.1)
SODIUM: 139 mmol/L (ref 135–145)

## 2015-08-11 LAB — I-STAT TROPONIN, ED: Troponin i, poc: 0 ng/mL (ref 0.00–0.08)

## 2015-08-11 LAB — LIPASE, BLOOD: Lipase: 59 U/L — ABNORMAL HIGH (ref 11–51)

## 2015-08-11 LAB — HEPATIC FUNCTION PANEL
ALBUMIN: 3.8 g/dL (ref 3.5–5.0)
ALK PHOS: 72 U/L (ref 38–126)
ALT: 52 U/L (ref 14–54)
AST: 40 U/L (ref 15–41)
BILIRUBIN TOTAL: 0.4 mg/dL (ref 0.3–1.2)
Bilirubin, Direct: <0.1 mg/dL — ABNORMAL LOW (ref 0.1–0.5)
TOTAL PROTEIN: 7 g/dL (ref 6.5–8.1)

## 2015-08-11 LAB — URINALYSIS, ROUTINE W REFLEX MICROSCOPIC
Bilirubin Urine: NEGATIVE
Glucose, UA: NEGATIVE mg/dL
Ketones, ur: NEGATIVE mg/dL
NITRITE: NEGATIVE
PH: 6 (ref 5.0–8.0)
Protein, ur: NEGATIVE mg/dL
SPECIFIC GRAVITY, URINE: 1.015 (ref 1.005–1.030)

## 2015-08-11 LAB — URINE MICROSCOPIC-ADD ON

## 2015-08-11 LAB — CBC
HEMATOCRIT: 38.9 % (ref 36.0–46.0)
Hemoglobin: 13.2 g/dL (ref 12.0–15.0)
MCH: 30.1 pg (ref 26.0–34.0)
MCHC: 33.9 g/dL (ref 30.0–36.0)
MCV: 88.6 fL (ref 78.0–100.0)
PLATELETS: 319 10*3/uL (ref 150–400)
RBC: 4.39 MIL/uL (ref 3.87–5.11)
RDW: 12.9 % (ref 11.5–15.5)
WBC: 9 10*3/uL (ref 4.0–10.5)

## 2015-08-11 LAB — BRAIN NATRIURETIC PEPTIDE: B Natriuretic Peptide: 14.8 pg/mL (ref 0.0–100.0)

## 2015-08-11 MED ORDER — ASPIRIN 81 MG PO CHEW
324.0000 mg | CHEWABLE_TABLET | Freq: Once | ORAL | Status: AC
  Administered 2015-08-11: 324 mg via ORAL
  Filled 2015-08-11: qty 4
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
## 2015-08-11 NOTE — ED Notes (Signed)
Patient presents for centralized chest pressure and SOB x2 days. Seen yesterday for same. Reports pain radiates down left arm. Denies lightheadedness, dizziness, N/V, back or neck pain.
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
## 2015-08-12 ENCOUNTER — Observation Stay (HOSPITAL_BASED_OUTPATIENT_CLINIC_OR_DEPARTMENT_OTHER): Payer: 59

## 2015-08-12 ENCOUNTER — Encounter (HOSPITAL_COMMUNITY): Payer: Self-pay

## 2015-08-12 DIAGNOSIS — F419 Anxiety disorder, unspecified: Secondary | ICD-10-CM

## 2015-08-12 DIAGNOSIS — R079 Chest pain, unspecified: Secondary | ICD-10-CM | POA: Diagnosis not present

## 2015-08-12 DIAGNOSIS — I959 Hypotension, unspecified: Secondary | ICD-10-CM | POA: Diagnosis not present

## 2015-08-12 DIAGNOSIS — R0789 Other chest pain: Secondary | ICD-10-CM

## 2015-08-12 DIAGNOSIS — R42 Dizziness and giddiness: Secondary | ICD-10-CM

## 2015-08-12 DIAGNOSIS — E785 Hyperlipidemia, unspecified: Secondary | ICD-10-CM

## 2015-08-12 DIAGNOSIS — R52 Pain, unspecified: Secondary | ICD-10-CM | POA: Diagnosis not present

## 2015-08-12 LAB — TROPONIN I
Troponin I: <0.03 ng/mL (ref <0.031)
Troponin I: <0.03 ng/mL (ref <0.031)

## 2015-08-12 LAB — PROTIME-INR
INR: 0.86 (ref 0.00–1.49)
Prothrombin Time: 12 seconds (ref 11.6–15.2)

## 2015-08-12 LAB — LIPID PANEL
CHOL/HDL RATIO: 4.6 ratio
Cholesterol: 169 mg/dL (ref 0–200)
HDL: 37 mg/dL — ABNORMAL LOW (ref >40)
LDL Cholesterol: 97 mg/dL (ref 0–99)
Triglycerides: 176 mg/dL — ABNORMAL HIGH (ref <150)
VLDL: 35 mg/dL (ref 0–40)

## 2015-08-12 LAB — APTT: APTT: 33 s (ref 24–37)

## 2015-08-12 LAB — RAPID URINE DRUG SCREEN, HOSP PERFORMED
Amphetamines: NOT DETECTED
BARBITURATES: NOT DETECTED
BENZODIAZEPINES: NOT DETECTED
Cocaine: NOT DETECTED
Opiates: NOT DETECTED
Tetrahydrocannabinol: NOT DETECTED

## 2015-08-12 LAB — TSH: TSH: 2.099 u[IU]/mL (ref 0.350–4.500)

## 2015-08-12 LAB — ECHOCARDIOGRAM COMPLETE
Height: 62 in
WEIGHTICAEL: 3075.86 [oz_av]

## 2015-08-12 LAB — CORTISOL-AM, BLOOD: CORTISOL - AM: 11.7 ug/dL (ref 6.7–22.6)

## 2015-08-12 MED ORDER — VITAMIN B-12 100 MCG PO TABS
100.0000 ug | ORAL_TABLET | Freq: Every day | ORAL | Status: DC
  Administered 2015-08-12: 100 ug via ORAL
  Filled 2015-08-12: qty 1

## 2015-08-12 MED ORDER — VITAMIN C 500 MG PO TABS
500.0000 mg | ORAL_TABLET | Freq: Every day | ORAL | Status: DC
  Administered 2015-08-12: 500 mg via ORAL
  Filled 2015-08-12: qty 1

## 2015-08-12 MED ORDER — ZOLPIDEM TARTRATE 5 MG PO TABS: 5.0000 mg | ORAL_TABLET | Freq: Every evening | ORAL | Status: DC | PRN

## 2015-08-12 MED ORDER — SIMVASTATIN 40 MG PO TABS: 40.0000 mg | ORAL_TABLET | Freq: Every evening | ORAL | Status: DC

## 2015-08-12 MED ORDER — FERROUS SULFATE 325 (65 FE) MG PO TABS
325.0000 mg | ORAL_TABLET | Freq: Every day | ORAL | Status: DC
  Administered 2015-08-12: 325 mg via ORAL
  Filled 2015-08-12: qty 1

## 2015-08-12 MED ORDER — ACETAMINOPHEN 325 MG PO TABS
650.0000 mg | ORAL_TABLET | ORAL | Status: DC | PRN
Start: 2015-08-12 — End: 2015-08-12

## 2015-08-12 MED ORDER — LORATADINE 10 MG PO TABS: 10.0000 mg | ORAL_TABLET | Freq: Every day | ORAL | Status: DC | PRN

## 2015-08-12 MED ORDER — ASPIRIN 81 MG PO CHEW
324.0000 mg | CHEWABLE_TABLET | Freq: Every day | ORAL | Status: DC
  Administered 2015-08-12: 324 mg via ORAL
  Filled 2015-08-12: qty 4

## 2015-08-12 MED ORDER — VITAMIN D 1000 UNITS PO TABS
1000.0000 [IU] | ORAL_TABLET | Freq: Every day | ORAL | Status: DC
  Administered 2015-08-12: 1000 [IU] via ORAL
  Filled 2015-08-12 (×2): qty 1

## 2015-08-12 MED ORDER — ALBUTEROL SULFATE (2.5 MG/3ML) 0.083% IN NEBU: 2.5000 mg | INHALATION_SOLUTION | Freq: Four times a day (QID) | RESPIRATORY_TRACT | Status: DC | PRN

## 2015-08-12 MED ORDER — HEPARIN SODIUM (PORCINE) 5000 UNIT/ML IJ SOLN
5000.0000 [IU] | Freq: Three times a day (TID) | INTRAMUSCULAR | Status: DC
  Administered 2015-08-12 (×3): 5000 [IU] via SUBCUTANEOUS
  Filled 2015-08-12 (×3): qty 1

## 2015-08-12 MED ORDER — METOPROLOL TARTRATE 25 MG PO TABS: 12.5000 mg | ORAL_TABLET | Freq: Two times a day (BID) | ORAL | Status: DC

## 2015-08-12 MED ORDER — PANTOPRAZOLE SODIUM 40 MG PO TBEC
40.0000 mg | DELAYED_RELEASE_TABLET | Freq: Every day | ORAL | Status: DC
  Administered 2015-08-12: 40 mg via ORAL
  Filled 2015-08-12: qty 1

## 2015-08-12 MED ORDER — ALPRAZOLAM 0.25 MG PO TABS: 0.2500 mg | ORAL_TABLET | Freq: Two times a day (BID) | ORAL | Status: DC | PRN

## 2015-08-12 MED ORDER — ONDANSETRON HCL 4 MG/2ML IJ SOLN: 4.0000 mg | Freq: Four times a day (QID) | INTRAMUSCULAR | Status: DC | PRN

## 2015-08-12 MED ORDER — NITROGLYCERIN 0.4 MG SL SUBL: 0.4000 mg | SUBLINGUAL_TABLET | SUBLINGUAL | Status: DC | PRN

## 2015-08-12 MED ORDER — MORPHINE SULFATE (PF) 2 MG/ML IV SOLN: 2.0000 mg | INTRAVENOUS | Status: DC | PRN

## 2015-08-12 MED ORDER — LORATADINE 10 MG PO TABS
10.0000 mg | ORAL_TABLET | Freq: Every day | ORAL | Status: DC
Start: 2015-08-12 — End: 2015-08-12
  Administered 2015-08-12: 10 mg via ORAL
  Filled 2015-08-12: qty 1

## 2015-08-12 MED ORDER — SODIUM CHLORIDE 0.9 % IV BOLUS (SEPSIS)
2000.0000 mL | Freq: Once | INTRAVENOUS | Status: AC
  Administered 2015-08-12: 2000 mL via INTRAVENOUS

## 2015-08-12 MED ORDER — ADULT MULTIVITAMIN W/MINERALS CH
1.0000 | ORAL_TABLET | Freq: Every day | ORAL | Status: DC
  Administered 2015-08-12: 1 via ORAL
  Filled 2015-08-12: qty 1

## 2015-08-12 MED ORDER — SODIUM CHLORIDE 0.9 % IV BOLUS (SEPSIS): 1000.0000 mL | Freq: Once | INTRAVENOUS | Status: DC

## 2015-08-12 MED ORDER — HYDROCORTISONE NA SUCCINATE PF 100 MG IJ SOLR
50.0000 mg | Freq: Once | INTRAMUSCULAR | Status: AC
  Administered 2015-08-12: 50 mg via INTRAVENOUS
  Filled 2015-08-12: qty 2

## 2015-08-12 MED ORDER — SODIUM CHLORIDE 0.9 % IV SOLN
INTRAVENOUS | Status: AC
  Administered 2015-08-12: 04:00:00 via INTRAVENOUS

## 2015-08-12 NOTE — Plan of Care (Signed)
Problem: Education: Goal: Knowledge of [facility] Education information/materials will improve Outcome: Progressing .  Problem: Tissue Perfusion: Goal: Risk factors for ineffective tissue perfusion will decrease Outcome: Progressing Order for venous duplex scan today.    Problem: Fluid Volume: Goal: Ability to maintain a balanced intake and output will improve Outcome: Progressing Continue with IVF.    Problem: Nutrition: Goal: Adequate nutrition will be maintained Outcome: Completed/Met Date Met:  08/12/15 .

## 2015-08-12 NOTE — Progress Notes (Signed)
*  PRELIMINARY RESULTS* Echocardiogram 2D Echocardiogram has been performed.  Jeryl Columbialliott, Zoe Goonan 08/12/2015, 3:51 PM

## 2015-08-12 NOTE — Progress Notes (Signed)
VASCULAR LAB PRELIMINARY  PRELIMINARY  PRELIMINARY  PRELIMINARY  Bilateral lower extremity venous duplex completed.    Preliminary report:  Bilateral:  No evidence of DVT, superficial thrombosis, or Baker's Cyst.   Merryn Thaker, RVS 08/12/2015, 2:09 PM
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
## 2015-08-12 NOTE — Discharge Instructions (Signed)
Follow with Primary MD WOLTERS,SHARON A, MD in 7 days  ° °Get CBC, CMP, 2 view Chest X ray checked  by Primary MD next visit.  ° ° °Activity: As tolerated with Full fall precautions use walker/cane & assistance as needed ° ° °Disposition Home  ° ° °Diet: Heart Healthy , with feeding assistance and aspiration precautions. ° °For Heart failure patients - Check your Weight same time everyday, if you gain over 2 pounds, or you develop in leg swelling, experience more shortness of breath or chest pain, call your Primary MD immediately. Follow Cardiac Low Salt Diet and 1.5 lit/day fluid restriction. ° ° °On your next visit with your primary care physician please Get Medicines reviewed and adjusted. ° ° °Please request your Prim.MD to go over all Hospital Tests and Procedure/Radiological results at the follow up, please get all Hospital records sent to your Prim MD by signing hospital release before you go home. ° ° °If you experience worsening of your admission symptoms, develop shortness of breath, life threatening emergency, suicidal or homicidal thoughts you must seek medical attention immediately by calling 911 or calling your MD immediately  if symptoms less severe. ° °You Must read complete instructions/literature along with all the possible adverse reactions/side effects for all the Medicines you take and that have been prescribed to you. Take any new Medicines after you have completely understood and accpet all the possible adverse reactions/side effects.  ° °Do not drive, operating heavy machinery, perform activities at heights, swimming or participation in water activities or provide baby sitting services if your were admitted for syncope or siezures until you have seen by Primary MD or a Neurologist and advised to do so again. ° °Do not drive when taking Pain medications.  ° ° °Do not take more than prescribed Pain, Sleep and Anxiety Medications ° °Special Instructions: If you have smoked or chewed Tobacco  in  the last 2 yrs please stop smoking, stop any regular Alcohol  and or any Recreational drug use. ° °Wear Seat belts while driving. ° ° °Please note ° °You were cared for by a hospitalist during your hospital stay. If you have any questions about your discharge medications or the care you received while you were in the hospital after you are discharged, you can call the unit and asked to speak with the hospitalist on call if the hospitalist that took care of you is not available. Once you are discharged, your primary care physician will handle any further medical issues. Please note that NO REFILLS for any discharge medications will be authorized once you are discharged, as it is imperative that you return to your primary care physician (or establish a relationship with a primary care physician if you do not have one) for your aftercare needs so that they can reassess your need for medications and monitor your lab values. ° °

## 2015-08-12 NOTE — Evaluation (Signed)
Physical Therapy Evaluation Patient Details Name: Jasmine Hicks MRN: [id number] DOB: [birth year] Today's Date: 08/12/2015   History of Present Illness  60 y.o. female with PMH of hypertension, hyperlipidemia, GERD, anxiety, possible TIA, endometriosis, who presents with chest pressure, shortness of breath and dizziness  Clinical Impression  Patient evaluated by Physical Therapy with no further acute PT needs identified. All education has been completed and the patient has no further questions.  Pt ambulated around unit without any symptoms/complaints. See below for any follow-up Physical Therapy or equipment needs. PT is signing off. Thank you for this referral.     Follow Up Recommendations No PT follow up    Equipment Recommendations  None recommended by PT    Recommendations for Other Services       Precautions / Restrictions Precautions Precautions: None      Mobility  Bed Mobility Overal bed mobility: Independent                Transfers Overall transfer level: Independent                  Ambulation/Gait Ambulation/Gait assistance: Supervision;Modified independent (Device/Increase time) Ambulation Distance (Feet): 400 Feet Assistive device: None Gait Pattern/deviations: WFL(Within Functional Limits)     General Gait Details: pt pushed IV pole but did not appear to need for support  Stairs            Wheelchair Mobility    Modified Rankin (Stroke Patients Only)       Balance Overall balance assessment: No apparent balance deficits (not formally assessed)                                           Pertinent Vitals/Pain Pain Assessment: No/denies pain    Home Living Family/patient expects to be discharged to:: Private residence Living Arrangements: Spouse/significant other             Home Equipment: None      Prior Function Level of Independence: Independent               Hand Dominance         Extremity/Trunk Assessment   Upper Extremity Assessment: Overall WFL for tasks assessed           Lower Extremity Assessment: Overall WFL for tasks assessed         Communication   Communication: No difficulties  Cognition Arousal/Alertness: Awake/alert Behavior During Therapy: WFL for tasks assessed/performed Overall Cognitive Status: Within Functional Limits for tasks assessed                      General Comments      Exercises        Assessment/Plan    PT Assessment Patent does not need any further PT services  PT Diagnosis Difficulty walking   PT Problem List    PT Treatment Interventions     PT Goals (Current goals can be found in the Care Plan section) Acute Rehab PT Goals PT Goal Formulation: All assessment and education complete, DC therapy    Frequency     Barriers to discharge        Co-evaluation               End of Session   Activity Tolerance: Patient tolerated treatment well Patient left: in bed;with call bell/phone within reach;with family/visitor present  Functional Assessment Tool Used: clinical judgement Functional Limitation: Mobility: Walking and moving around Mobility: Walking and Moving Around Current Status [phone](G8978): 0 percent impaired, limited or restricted Mobility: Walking and Moving Around Goal Status [phone](G8979): 0 percent impaired, limited or restricted Mobility: Walking and Moving Around Discharge Status [phone](G8980): 0 percent impaired, limited or restricted    Time: 1003-1011 PT Time Calculation (min) (ACUTE ONLY): 8 min   Charges:   PT Evaluation $PT Eval Low Complexity: 1 Procedure     PT G Codes:   PT G-Codes **NOT FOR INPATIENT CLASS** Functional Assessment Tool Used: clinical judgement Functional Limitation: Mobility: Walking and moving around Mobility: Walking and Moving Around Current Status ([id number](G8978): 0 percent impaired, limited or restricted Mobility: Walking and Moving Around Goal Status  (G9562(G8979): 0 percent impaired, limited or restricted Mobility: Walking and Moving Around Discharge Status ([phone](G8980): 0 percent impaired, limited or restricted    Fayelynn Distel,KATHrine E 08/12/2015, 12:01 PM Zenovia JarredKati Samhitha Rosen, PT, DPT 08/12/2015 Pager: [phone]

## 2015-08-13 LAB — HEMOGLOBIN A1C
Hgb A1c MFr Bld: 6.3 % — ABNORMAL HIGH (ref 4.8–5.6)
MEAN PLASMA GLUCOSE: 134 mg/dL

## 2015-12-29 ENCOUNTER — Encounter (HOSPITAL_BASED_OUTPATIENT_CLINIC_OR_DEPARTMENT_OTHER): Payer: Self-pay | Admitting: Emergency Medicine

## 2015-12-29 ENCOUNTER — Emergency Department (HOSPITAL_BASED_OUTPATIENT_CLINIC_OR_DEPARTMENT_OTHER)
Admission: EM | Admit: 2015-12-29 | Discharge: 2015-12-29 | Disposition: A | Discharge status: Home / Self Care | Payer: 59 | Attending: Emergency Medicine | Admitting: Emergency Medicine

## 2015-12-29 ENCOUNTER — Emergency Department (HOSPITAL_BASED_OUTPATIENT_CLINIC_OR_DEPARTMENT_OTHER): Payer: 59

## 2015-12-29 DIAGNOSIS — K648 Other hemorrhoids: Secondary | ICD-10-CM | POA: Insufficient documentation

## 2015-12-29 DIAGNOSIS — R1030 Lower abdominal pain, unspecified: Secondary | ICD-10-CM

## 2015-12-29 DIAGNOSIS — K644 Residual hemorrhoidal skin tags: Secondary | ICD-10-CM

## 2015-12-29 DIAGNOSIS — R1032 Left lower quadrant pain: Secondary | ICD-10-CM | POA: Diagnosis present

## 2015-12-29 DIAGNOSIS — K5900 Constipation, unspecified: Secondary | ICD-10-CM

## 2015-12-29 LAB — BASIC METABOLIC PANEL
Anion gap: 8 (ref 5–15)
BUN: 12 mg/dL (ref 6–20)
CALCIUM: 8.7 mg/dL — AB (ref 8.9–10.3)
CHLORIDE: 107 mmol/L (ref 101–111)
CO2: 24 mmol/L (ref 22–32)
CREATININE: 0.79 mg/dL (ref 0.44–1.00)
GFR calc Af Amer: >60 mL/min (ref >60)
GFR calc non Af Amer: >60 mL/min (ref >60)
Glucose, Bld: 130 mg/dL — ABNORMAL HIGH (ref 65–99)
Potassium: 3.7 mmol/L (ref 3.5–5.1)
SODIUM: 139 mmol/L (ref 135–145)

## 2015-12-29 LAB — CBC WITH DIFFERENTIAL/PLATELET
Basophils Absolute: 0.1 10*3/uL (ref 0.0–0.1)
Basophils Relative: 1 %
EOS ABS: 0.1 10*3/uL (ref 0.0–0.7)
Eosinophils Relative: 1 %
HEMATOCRIT: 39.3 % (ref 36.0–46.0)
HEMOGLOBIN: 13.4 g/dL (ref 12.0–15.0)
LYMPHS ABS: 2.7 10*3/uL (ref 0.7–4.0)
Lymphocytes Relative: 33 %
MCH: 30.6 pg (ref 26.0–34.0)
MCHC: 34.1 g/dL (ref 30.0–36.0)
MCV: 89.7 fL (ref 78.0–100.0)
MONO ABS: 0.7 10*3/uL (ref 0.1–1.0)
MONOS PCT: 9 %
NEUTROS PCT: 56 %
Neutro Abs: 4.6 10*3/uL (ref 1.7–7.7)
Platelets: 300 10*3/uL (ref 150–400)
RBC: 4.38 MIL/uL (ref 3.87–5.11)
RDW: 13.1 % (ref 11.5–15.5)
WBC: 8.1 10*3/uL (ref 4.0–10.5)

## 2015-12-29 MED ORDER — FENTANYL CITRATE (PF) 100 MCG/2ML IJ SOLN
100.0000 ug | Freq: Once | INTRAMUSCULAR | Status: AC
  Administered 2015-12-29: 100 ug via INTRAVENOUS
  Filled 2015-12-29: qty 2

## 2015-12-29 MED ORDER — ONDANSETRON HCL 4 MG/2ML IJ SOLN
4.0000 mg | Freq: Once | INTRAMUSCULAR | Status: AC
  Administered 2015-12-29: 4 mg via INTRAVENOUS
  Filled 2015-12-29: qty 2

## 2015-12-29 MED ORDER — IOPAMIDOL (ISOVUE-300) INJECTION 61%
100.0000 mL | Freq: Once | INTRAVENOUS | Status: AC | PRN
  Administered 2015-12-29: 100 mL via INTRAVENOUS

## 2015-12-29 MED ORDER — SODIUM CHLORIDE 0.9 % IV SOLN
Freq: Once | INTRAVENOUS | Status: AC
  Administered 2015-12-29: 06:00:00 via INTRAVENOUS

## 2015-12-29 NOTE — ED Triage Notes (Signed)
Pt report eye surgery on Monday past were she received anesthesia now states having difficulty with BM and is now see blood in stool.

## 2015-12-29 NOTE — Discharge Instructions (Signed)
It was our pleasure to provide your ER care today - we hope that you feel better.  Drink plenty of fluids. Get adequate fiber in diet.    You may take colace (stool softener) and miralax (laxative) as need - both of these medications are available over the counter.  Follow up with primary care doctor in 1 week if symptoms fail to improve/resolve.  Return to ER if worse, new symptoms, fevers, severe pain, other concern.

## 2015-12-29 NOTE — ED Provider Notes (Signed)
Patient signed out by Dr Read DriversMolpus, that ct pending for diverticulitis, and that pt also recently constipated, and has ext hem w small amt blood from hem.  Ct negative acute.   No current abd pain or nv. No fever.  Will rec colace and miralax for home.  pcp f/u.   Patient currently appears stable for d/c.   Vitals:   12/29/15 0505 12/29/15 0825  BP: 120/78 121/74  Pulse: 82 72  Resp: 20 18  Temp: 98.1 F (36.7 C)       Cathren LaineKevin Joylynn Defrancesco, MD 12/29/15 [phone]
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
## 2016-04-12 ENCOUNTER — Encounter: Payer: Self-pay | Admitting: Obstetrics and Gynecology

## 2016-07-03 ENCOUNTER — Other Ambulatory Visit: Payer: Self-pay | Admitting: Family Medicine

## 2016-07-03 ENCOUNTER — Ambulatory Visit
Admission: RE | Admit: 2016-07-03 | Discharge: 2016-07-03 | Disposition: A | Discharge status: Home / Self Care | Payer: Managed Care, Other (non HMO) | Source: Ambulatory Visit | Attending: Family Medicine | Admitting: Family Medicine

## 2016-07-03 DIAGNOSIS — R059 Cough, unspecified: Secondary | ICD-10-CM

## 2016-07-03 DIAGNOSIS — R05 Cough: Secondary | ICD-10-CM

## 2016-07-26 ENCOUNTER — Emergency Department (HOSPITAL_COMMUNITY): Payer: Managed Care, Other (non HMO)

## 2016-07-26 ENCOUNTER — Encounter (HOSPITAL_COMMUNITY): Payer: Self-pay

## 2016-07-26 ENCOUNTER — Observation Stay (HOSPITAL_COMMUNITY)
Admission: EM | Admit: 2016-07-26 | Discharge: 2016-07-27 | Disposition: A | Discharge status: Home / Self Care | Payer: Managed Care, Other (non HMO) | Attending: Internal Medicine | Admitting: Internal Medicine

## 2016-07-26 DIAGNOSIS — E785 Hyperlipidemia, unspecified: Secondary | ICD-10-CM | POA: Diagnosis present

## 2016-07-26 DIAGNOSIS — Z8673 Personal history of transient ischemic attack (TIA), and cerebral infarction without residual deficits: Secondary | ICD-10-CM | POA: Insufficient documentation

## 2016-07-26 DIAGNOSIS — R0789 Other chest pain: Principal | ICD-10-CM | POA: Insufficient documentation

## 2016-07-26 DIAGNOSIS — R079 Chest pain, unspecified: Secondary | ICD-10-CM | POA: Diagnosis present

## 2016-07-26 DIAGNOSIS — Z79899 Other long term (current) drug therapy: Secondary | ICD-10-CM | POA: Insufficient documentation

## 2016-07-26 DIAGNOSIS — R0602 Shortness of breath: Secondary | ICD-10-CM | POA: Diagnosis not present

## 2016-07-26 DIAGNOSIS — E784 Other hyperlipidemia: Secondary | ICD-10-CM | POA: Diagnosis not present

## 2016-07-26 DIAGNOSIS — K219 Gastro-esophageal reflux disease without esophagitis: Secondary | ICD-10-CM | POA: Diagnosis present

## 2016-07-26 LAB — I-STAT TROPONIN, ED
TROPONIN I, POC: 0 ng/mL (ref 0.00–0.08)
Troponin i, poc: 0 ng/mL (ref 0.00–0.08)

## 2016-07-26 LAB — BASIC METABOLIC PANEL
ANION GAP: 7 (ref 5–15)
BUN: 14 mg/dL (ref 6–20)
CALCIUM: 8.8 mg/dL — AB (ref 8.9–10.3)
CHLORIDE: 107 mmol/L (ref 101–111)
CO2: 25 mmol/L (ref 22–32)
Creatinine, Ser: 0.86 mg/dL (ref 0.44–1.00)
GFR calc Af Amer: >60 mL/min (ref >60)
GFR calc non Af Amer: >60 mL/min (ref >60)
Glucose, Bld: 148 mg/dL — ABNORMAL HIGH (ref 65–99)
Potassium: 3.5 mmol/L (ref 3.5–5.1)
Sodium: 139 mmol/L (ref 135–145)

## 2016-07-26 LAB — CBC
HCT: 39.3 % (ref 36.0–46.0)
HCT: 37.8 % (ref 36.0–46.0)
HEMOGLOBIN: 13.3 g/dL (ref 12.0–15.0)
Hemoglobin: 12.5 g/dL (ref 12.0–15.0)
MCH: 30.6 pg (ref 26.0–34.0)
MCH: 30.3 pg (ref 26.0–34.0)
MCHC: 33.8 g/dL (ref 30.0–36.0)
MCHC: 33.1 g/dL (ref 30.0–36.0)
MCV: 90.6 fL (ref 78.0–100.0)
MCV: 91.7 fL (ref 78.0–100.0)
Platelets: 291 10*3/uL (ref 150–400)
Platelets: 260 10*3/uL (ref 150–400)
RBC: 4.34 MIL/uL (ref 3.87–5.11)
RBC: 4.12 MIL/uL (ref 3.87–5.11)
RDW: 13 % (ref 11.5–15.5)
RDW: 13.4 % (ref 11.5–15.5)
WBC: 9.5 10*3/uL (ref 4.0–10.5)
WBC: 8.8 10*3/uL (ref 4.0–10.5)

## 2016-07-26 LAB — D-DIMER, QUANTITATIVE (NOT AT ARMC)

## 2016-07-26 MED ORDER — SODIUM CHLORIDE 0.9 % IV BOLUS (SEPSIS)
1000.0000 mL | Freq: Once | INTRAVENOUS | Status: AC
  Administered 2016-07-26: 1000 mL via INTRAVENOUS

## 2016-07-26 MED ORDER — BENZONATATE 100 MG PO CAPS: 100.0000 mg | ORAL_CAPSULE | Freq: Three times a day (TID) | ORAL | 0 refills | Status: DC | PRN

## 2016-07-26 MED ORDER — ALBUTEROL SULFATE (2.5 MG/3ML) 0.083% IN NEBU: 2.5000 mg | INHALATION_SOLUTION | Freq: Four times a day (QID) | RESPIRATORY_TRACT | Status: DC | PRN

## 2016-07-26 MED ORDER — SODIUM CHLORIDE 0.9 % IV SOLN
INTRAVENOUS | Status: DC
  Administered 2016-07-27: 01:00:00 via INTRAVENOUS

## 2016-07-26 MED ORDER — LORATADINE 10 MG PO TABS: 10.0000 mg | ORAL_TABLET | Freq: Every day | ORAL | Status: DC | PRN

## 2016-07-26 MED ORDER — GI COCKTAIL ~~LOC~~
30.0000 mL | Freq: Once | ORAL | Status: AC
  Administered 2016-07-26: 30 mL via ORAL
  Filled 2016-07-26: qty 30

## 2016-07-26 MED ORDER — ACETAMINOPHEN 325 MG PO TABS: 650.0000 mg | ORAL_TABLET | ORAL | Status: DC | PRN

## 2016-07-26 MED ORDER — ONDANSETRON HCL 4 MG/2ML IJ SOLN
4.0000 mg | Freq: Once | INTRAMUSCULAR | Status: AC
  Administered 2016-07-26: 4 mg via INTRAVENOUS
  Filled 2016-07-26: qty 2

## 2016-07-26 MED ORDER — NITROGLYCERIN 0.4 MG SL SUBL
0.4000 mg | SUBLINGUAL_TABLET | SUBLINGUAL | Status: DC | PRN
  Administered 2016-07-26: 0.4 mg via SUBLINGUAL
  Filled 2016-07-26: qty 1

## 2016-07-26 MED ORDER — ONDANSETRON HCL 4 MG/2ML IJ SOLN: 4.0000 mg | Freq: Four times a day (QID) | INTRAMUSCULAR | Status: DC | PRN

## 2016-07-26 MED ORDER — PANTOPRAZOLE SODIUM 40 MG PO TBEC
40.0000 mg | DELAYED_RELEASE_TABLET | Freq: Every day | ORAL | Status: DC
  Administered 2016-07-27: 40 mg via ORAL
  Filled 2016-07-26: qty 1

## 2016-07-26 MED ORDER — ENOXAPARIN SODIUM 40 MG/0.4ML ~~LOC~~ SOLN
40.0000 mg | SUBCUTANEOUS | Status: DC
  Administered 2016-07-27: 40 mg via SUBCUTANEOUS
  Filled 2016-07-26: qty 0.4

## 2016-07-26 MED ORDER — ASPIRIN EC 325 MG PO TBEC
325.0000 mg | DELAYED_RELEASE_TABLET | Freq: Every day | ORAL | Status: DC
  Administered 2016-07-27: 325 mg via ORAL
  Filled 2016-07-26: qty 1

## 2016-07-26 MED ORDER — SIMVASTATIN 40 MG PO TABS
40.0000 mg | ORAL_TABLET | Freq: Every evening | ORAL | Status: DC
  Filled 2016-07-26: qty 1

## 2016-07-26 NOTE — ED Notes (Signed)
Pt able to ambulate to bathroom.

## 2016-07-26 NOTE — ED Triage Notes (Signed)
Pt presents to the ed with ems after having some chest pain at with with shortness of breath, nausea and palpitation.  She is hypertensive en route.  She has received 4 baby aspirin, 1 nitro and 4 mg of zofran iv en route with ems.  No relief with nitro in pain
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
## 2016-07-26 NOTE — ED Notes (Signed)
Pt reports feeling nauseous and dizzy, admitting at bedside, reports will get a step down bed

## 2016-07-26 NOTE — ED Notes (Signed)
Pt given ginger ale per RN.  °

## 2016-07-26 NOTE — ED Notes (Signed)
Pt reports still having a slight pressure in her chest, edp notified

## 2016-07-26 NOTE — ED Notes (Signed)
Family at bedside. 
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
## 2016-07-27 ENCOUNTER — Other Ambulatory Visit: Payer: Self-pay | Admitting: Student

## 2016-07-27 ENCOUNTER — Encounter (HOSPITAL_COMMUNITY): Payer: Self-pay | Admitting: *Deleted

## 2016-07-27 DIAGNOSIS — E784 Other hyperlipidemia: Secondary | ICD-10-CM | POA: Diagnosis not present

## 2016-07-27 DIAGNOSIS — K219 Gastro-esophageal reflux disease without esophagitis: Secondary | ICD-10-CM | POA: Diagnosis not present

## 2016-07-27 DIAGNOSIS — R0789 Other chest pain: Secondary | ICD-10-CM

## 2016-07-27 DIAGNOSIS — R079 Chest pain, unspecified: Secondary | ICD-10-CM

## 2016-07-27 LAB — TROPONIN I
Troponin I: <0.03 ng/mL (ref <0.03)
Troponin I: <0.03 ng/mL (ref <0.03)

## 2016-07-27 LAB — CREATININE, SERUM
CREATININE: 0.79 mg/dL (ref 0.44–1.00)
GFR calc Af Amer: >60 mL/min (ref >60)

## 2016-07-27 LAB — HIV ANTIBODY (ROUTINE TESTING W REFLEX): HIV Screen 4th Generation wRfx: NONREACTIVE

## 2016-07-27 LAB — MRSA PCR SCREENING: MRSA by PCR: NEGATIVE

## 2016-07-27 MED ORDER — ASPIRIN EC 81 MG PO TBEC: 81.0000 mg | DELAYED_RELEASE_TABLET | Freq: Every day | ORAL | Status: DC

## 2016-07-27 MED ORDER — BENZONATATE 100 MG PO CAPS: 100.0000 mg | ORAL_CAPSULE | Freq: Three times a day (TID) | ORAL | 0 refills | Status: DC | PRN

## 2016-07-27 NOTE — Progress Notes (Signed)
Pt discharged to home via w/c, condition stable, education complete, accompanied by spouse.  Kerem Gilmer RN 

## 2016-07-27 NOTE — Discharge Instructions (Signed)
° °  Chest Wall Pain Chest wall pain is pain in or around the bones and muscles of your chest. Sometimes, an injury causes this pain. Sometimes, the cause may not be known. This pain may take several weeks or longer to get better. Follow these instructions at home: Pay attention to any changes in your symptoms. Take these actions to help with your pain:  Rest as told by your health care provider.  Avoid activities that cause pain. These include any activities that use your chest muscles or your abdominal and side muscles to lift heavy items.  If directed, apply ice to the painful area:  Put ice in a plastic bag.  Place a towel between your skin and the bag.  Leave the ice on for 20 minutes, 2-3 times per day.  Take over-the-counter and prescription medicines only as told by your health care provider.  Do not use tobacco products, including cigarettes, chewing tobacco, and e-cigarettes. If you need help quitting, ask your health care provider.  Keep all follow-up visits as told by your health care provider. This is important. Contact a health care provider if:  You have a fever.  Your chest pain becomes worse.  You have new symptoms. Get help right away if:  You have nausea or vomiting.  You feel sweaty or light-headed.  You have a cough with phlegm (sputum) or you cough up blood.  You develop shortness of breath. This information is not intended to replace advice given to you by your health care provider. Make sure you discuss any questions you have with your health care provider. Document Released: 04/17/2005 Document Revised: 08/26/2015 Document Reviewed: 07/13/2014 Elsevier Interactive Patient Education  2017 [employer] You were seen today with chest pain. We are unable to find any cause for chest pain. It is possible that it is due to her heart but not likely due to acute event with your heart. We will need a follow-up with your primary care physician and we will also  give you follow up number for cardiology.
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
## 2016-07-27 NOTE — Discharge Summary (Signed)
Physician Discharge Summary  Jasmine Hicks ZOX:[id number] DOB: 08/20/1955 DOA: 07/26/2016  PCP: Emeterio Reeve, MD  Admit date: 07/26/2016 Discharge date: 07/27/2016  Time spent: >35 minutes  Recommendations for Outpatient Follow-up:  Cardiology in 1-2 weeks for outpatient stress test  PCP in 3-7 days as needed  Discharge Diagnoses:  Principal Problem:   Chest pain Active Problems:   Hyperlipidemia   GERD (gastroesophageal reflux disease)   Discharge Condition: stable   Diet recommendation: regular   Filed Weights   07/27/16 0002  Weight: 86.2 kg (190 lb)    History of present illness:  61 y.o. female with history of hyperlipidemia and GERD was brought to the ER for chest pain. Patient was at her workplace when patient started developing some palpitations and chest pressure. Chest pressure was retrosternal. EMS was called patient was given sublingual nitroglycerin following which a chest pressure improved and patient was brought to the ER.   ED Course: Chest pressure started recurring again in the ER and sublingual nitroglycerin was again given following which it improved. But blood pressure did drop. For which patient was given fluid bolus. EKG chest x-ray and cardiac markers were unremarkable. Patient admitted for further observation for chest pain.  Hospital Course:  Atypical chest pains. Trop: negative. No new episodes of cardiopulmonary symptoms while in the hospital. Patient is seen by cardiologist who recommended outpatient follow up with stress test . Being scheduled 08/02/16  Procedures:  none (i.e. Studies not automatically included, echos, thoracentesis, etc; not x-rays)  Consultations:  Cardiology   Discharge Exam: Vitals:   07/27/16 0451 07/27/16 0928  BP: 115/61 (!) 124/55  Pulse: 72 78  Resp: 17 17  Temp: 97.6 F (36.4 C)     General: no distress. Wants to go home  Cardiovascular: s1,s2 rrr Respiratory: CTA BL  Discharge  Instructions  Discharge Instructions    Diet - low sodium heart healthy    Complete by:  As directed    Increase activity slowly    Complete by:  As directed      Allergies as of 07/27/2016      Reactions   Gadolinium Derivatives Hives, Shortness Of Breath, Itching, Swelling, Cough   Multihance   Ivp Dye [iodinated Diagnostic Agents] Hives, Shortness Of Breath, Swelling   Metrizamide Shortness Of Breath, Swelling   Tetanus Toxoids Hives, Shortness Of Breath      Medication List    TAKE these medications   albuterol (2.5 MG/3ML) 0.083% nebulizer solution Commonly known as:  PROVENTIL Inhale 2.5 mg into the lungs every 6 (six) hours as needed for wheezing or shortness of breath.   aspirin EC 81 MG tablet Take 1 tablet (81 mg total) by mouth daily.   benzonatate 100 MG capsule Commonly known as:  TESSALON PERLES Take 1 capsule (100 mg total) by mouth 3 (three) times daily as needed for cough.   cetirizine 10 MG tablet Commonly known as:  ZYRTEC Take 10 mg by mouth daily as needed for allergies.   ferrous sulfate 325 (65 FE) MG tablet Take 325 mg by mouth daily with breakfast.   levocetirizine 5 MG tablet Commonly known as:  XYZAL Take 5 mg by mouth daily as needed for allergies.   MULTIVITAMIN PO Take 1 tablet by mouth daily.   pantoprazole 40 MG tablet Commonly known as:  PROTONIX Take 40 mg by mouth daily.   simvastatin 40 MG tablet Commonly known as:  ZOCOR Take 40 mg by mouth every evening.   VITAMIN  B-12 PO Take 1 tablet by mouth daily.   VITAMIN C PO Take 1 tablet by mouth daily.   VITAMIN D PO Take 1 tablet by mouth daily.      Allergies  Allergen Reactions  . Gadolinium Derivatives Hives, Shortness Of Breath, Itching, Swelling and Cough    Multihance  . Ivp Dye [Iodinated Diagnostic Agents] Hives, Shortness Of Breath and Swelling  . Metrizamide Shortness Of Breath and Swelling  . Tetanus Toxoids Hives and Shortness Of Breath   Follow-up  Information    [facility] Follow up on 08/02/2016.   Specialty:  Cardiology Why:  Treadmill Stress Test on 08/02/2016 at 3:30PM. Wear Tenn[employer]oes. Contact information: [address] [place] [id number] [phone]           The results of significant diagnostics from this hospitalization (including imaging, microbiology, ancillary and laboratory) are listed below for reference.    Significant Diagnostic Studies: Dg Chest 2 View  Result Date: 07/26/2016 CLINICAL DATA:  Chest pain, shortness breath, nausea EXAM: CHEST  2 VIEW COMPARISON:  Chest x-ray of 07/03/2016 FINDINGS: No active infiltrate or effusion is seen. Mediastinal and hilar contours are unremarkable. The heart is within normal limits in size. No bony abnormality is seen. IMPRESSION: No active cardiopulmonary disease. Electronically Signed   By: Dwyane DeePaul  Barry M.D.   On: 07/26/2016 15:08   Dg Chest 2 View  Result Date: 07/03/2016 CLINICAL DATA:  Patient c/o fever, dry cough, posterior chest pain, shortness of breath with exertion; hx non smoker EXAM: CHEST  2 VIEW COMPARISON:  08/11/2015 FINDINGS: The heart size and mediastinal contours are within normal limits. Both lungs are clear. The visualized skeletal structures are unremarkable. IMPRESSION: No active cardiopulmonary disease. Electronically Signed   By: Elige KoHetal  Patel   On: 07/03/2016 13:36    Microbiology: Recent Results (from the past 240 hour(s))  MRSA PCR Screening     Status: None   Collection Time: 07/26/16 11:57 PM  Result Value Ref Range Status   MRSA by PCR NEGATIVE NEGATIVE Final    Comment:        The GeneXpert MRSA Assay (FDA approved for NASAL specimens only), is one component of a comprehensive MRSA colonization surveillance program. It is not intended to diagnose MRSA infection nor to guide or monitor treatment for MRSA infections.      Labs: Basic Metabolic Panel:  Recent Labs Lab 07/26/16 1515  07/26/16 2302  NA 139  --   K 3.5  --   CL 107  --   CO2 25  --   GLUCOSE 148*  --   BUN 14  --   CREATININE 0.86 0.79  CALCIUM 8.8*  --    Liver Function Tests: No results for input(s): AST, ALT, ALKPHOS, BILITOT, PROT, ALBUMIN in the last 168 hours. No results for input(s): LIPASE, AMYLASE in the last 168 hours. No results for input(s): AMMONIA in the last 168 hours. CBC:  Recent Labs Lab 07/26/16 1515 07/26/16 2302  WBC 9.5 8.8  HGB 13.3 12.5  HCT 39.3 37.8  MCV 90.6 91.7  PLT 291 260   Cardiac Enzymes:  Recent Labs Lab 07/26/16 2302 07/27/16 0450 07/27/16 1038  TROPONINI <0.03 <0.03 <0.03   BNP: BNP (last 3 results)  Recent Labs  08/11/15 2219  BNP 14.8    ProBNP (last 3 results) No results for input(s): PROBNP in the last 8760 hours.  CBG: No results for input(s): GLUCAP in the last 168  hours.     Signed:  Esperanza Sheets  [facility] 07/27/2016, 12:43 PM

## 2016-07-28 ENCOUNTER — Telehealth (HOSPITAL_COMMUNITY): Payer: Self-pay

## 2016-07-28 NOTE — Telephone Encounter (Signed)
Encounter complete. 

## 2016-08-01 ENCOUNTER — Other Ambulatory Visit (HOSPITAL_COMMUNITY)
Admission: RE | Admit: 2016-08-01 | Discharge: 2016-08-01 | Disposition: A | Discharge status: Home / Self Care | Payer: Managed Care, Other (non HMO) | Source: Ambulatory Visit | Attending: Obstetrics & Gynecology | Admitting: Obstetrics & Gynecology

## 2016-08-01 ENCOUNTER — Encounter: Payer: Self-pay | Admitting: Obstetrics & Gynecology

## 2016-08-01 ENCOUNTER — Ambulatory Visit (INDEPENDENT_AMBULATORY_CARE_PROVIDER_SITE_OTHER): Payer: Managed Care, Other (non HMO) | Admitting: Obstetrics & Gynecology

## 2016-08-01 VITALS — BP 110/70 | HR 88 | Resp 16 | Ht 61.75 in | Wt 189.2 lb

## 2016-08-01 DIAGNOSIS — Z124 Encounter for screening for malignant neoplasm of cervix: Secondary | ICD-10-CM | POA: Diagnosis not present

## 2016-08-01 DIAGNOSIS — Z01411 Encounter for gynecological examination (general) (routine) with abnormal findings: Secondary | ICD-10-CM | POA: Diagnosis not present

## 2016-08-01 DIAGNOSIS — Z01419 Encounter for gynecological examination (general) (routine) without abnormal findings: Secondary | ICD-10-CM | POA: Diagnosis not present

## 2016-08-01 NOTE — Addendum Note (Signed)
Addended by: Jerene Bears on: 08/01/2016 02:34 PM   Modules accepted: Orders

## 2016-08-01 NOTE — Patient Instructions (Addendum)
Plan to do bone density with your mammogram.    New shingles vaccine:  Shingrix   Fertility specialist:  Fermin Schwab, MD
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
## 2016-08-02 ENCOUNTER — Ambulatory Visit (HOSPITAL_COMMUNITY)
Admit: 2016-08-02 | Discharge: 2016-08-02 | Disposition: A | Discharge status: Home / Self Care | Payer: Managed Care, Other (non HMO) | Attending: Cardiology | Admitting: Cardiology

## 2016-08-02 DIAGNOSIS — R0789 Other chest pain: Secondary | ICD-10-CM | POA: Insufficient documentation

## 2016-08-02 LAB — EXERCISE TOLERANCE TEST
CHL CUP MPHR: 158 {beats}/min
CHL CUP RESTING HR STRESS: 101 {beats}/min
CSEPEDS: 28 s
CSEPEW: 7.7 METS
CSEPPHR: 153 {beats}/min
Exercise duration (min): 6 min
Percent HR: 96 %
RPE: 18

## 2016-08-03 ENCOUNTER — Telehealth: Payer: Self-pay | Admitting: Student

## 2016-08-03 NOTE — Telephone Encounter (Signed)
Follow Up: ° ° ° °Returning your call,concerning her Stress Test results. °

## 2016-08-04 LAB — CYTOLOGY - PAP: Diagnosis: NEGATIVE

## 2016-08-04 NOTE — Telephone Encounter (Signed)
-----   Message from Ellsworth Lennox, [place] sent at 08/03/2016  7:51 AM EDT ----- Please let the patient know her stress test showed a normal exercise capacity with no evidence of ischemia. Thank you!

## 2016-08-07 ENCOUNTER — Other Ambulatory Visit: Payer: Self-pay | Admitting: Obstetrics & Gynecology

## 2016-08-07 ENCOUNTER — Telehealth: Payer: Self-pay | Admitting: *Deleted

## 2016-08-07 MED ORDER — TERCONAZOLE 0.4 % VA CREA: 1.0000 | TOPICAL_CREAM | Freq: Every day | VAGINAL | 0 refills | Status: DC

## 2016-08-07 NOTE — Progress Notes (Signed)
Rx sent to wrong pharmacy. New Rx sent to [facility]

## 2016-08-07 NOTE — Addendum Note (Signed)
Addended by: Loreta Ave on: 08/07/2016 11:36 AM   Modules accepted: Orders

## 2016-08-07 NOTE — Telephone Encounter (Signed)
NOTES SENT TO SCHEDULING.  °
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
## 2016-08-11 ENCOUNTER — Encounter: Payer: Self-pay | Admitting: Internal Medicine

## 2016-08-11 ENCOUNTER — Ambulatory Visit (INDEPENDENT_AMBULATORY_CARE_PROVIDER_SITE_OTHER): Payer: Managed Care, Other (non HMO) | Admitting: Internal Medicine

## 2016-08-11 VITALS — BP 132/78 | HR 95 | Ht 61.75 in | Wt 185.8 lb

## 2016-08-11 DIAGNOSIS — R42 Dizziness and giddiness: Secondary | ICD-10-CM | POA: Diagnosis not present

## 2016-08-11 DIAGNOSIS — R072 Precordial pain: Secondary | ICD-10-CM | POA: Diagnosis not present

## 2016-08-11 DIAGNOSIS — R002 Palpitations: Secondary | ICD-10-CM

## 2016-08-11 LAB — URINALYSIS
Bilirubin, UA: NEGATIVE
Glucose, UA: NEGATIVE
KETONES UA: NEGATIVE
Nitrite, UA: NEGATIVE
PROTEIN UA: NEGATIVE
RBC UA: NEGATIVE
SPEC GRAV UA: 1.021 (ref 1.005–1.030)
Urobilinogen, Ur: 0.2 mg/dL (ref 0.2–1.0)
pH, UA: 5 (ref 5.0–7.5)

## 2016-08-11 NOTE — Patient Instructions (Signed)
Your physician recommends that you continue on your current medications as directed. Please refer to the Current Medication list given to you today.  Your physician recommends that you return for urinalysis (UA) today.  Increase your salt and fluid intake daily.  Your physician recommends that you schedule a follow-up appointment at the end of May with Dr. Tenny Craw.

## 2016-09-06 ENCOUNTER — Encounter: Payer: Self-pay | Admitting: Internal Medicine
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
## 2016-09-22 ENCOUNTER — Ambulatory Visit (INDEPENDENT_AMBULATORY_CARE_PROVIDER_SITE_OTHER): Payer: Managed Care, Other (non HMO) | Admitting: Internal Medicine

## 2016-09-22 ENCOUNTER — Encounter: Payer: Self-pay | Admitting: Internal Medicine

## 2016-09-22 ENCOUNTER — Ambulatory Visit: Payer: Managed Care, Other (non HMO) | Admitting: Internal Medicine

## 2016-09-22 ENCOUNTER — Encounter (INDEPENDENT_AMBULATORY_CARE_PROVIDER_SITE_OTHER): Payer: Self-pay

## 2016-09-22 VITALS — BP 110/68 | HR 95 | Ht 62.0 in | Wt 187.4 lb

## 2016-09-22 DIAGNOSIS — R002 Palpitations: Secondary | ICD-10-CM

## 2016-09-22 DIAGNOSIS — I951 Orthostatic hypotension: Secondary | ICD-10-CM

## 2016-09-22 DIAGNOSIS — R072 Precordial pain: Secondary | ICD-10-CM | POA: Diagnosis not present

## 2016-09-22 NOTE — Patient Instructions (Signed)
Your physician recommends that you continue on your current medications as directed. Please refer to the Current Medication list given to you today. Your physician recommends that you schedule a follow-up appointment in: as needed with Dr. Ross.   

## 2017-05-02 ENCOUNTER — Encounter (HOSPITAL_BASED_OUTPATIENT_CLINIC_OR_DEPARTMENT_OTHER): Payer: Self-pay | Admitting: *Deleted

## 2017-05-02 ENCOUNTER — Emergency Department (HOSPITAL_BASED_OUTPATIENT_CLINIC_OR_DEPARTMENT_OTHER)
Admission: EM | Admit: 2017-05-02 | Discharge: 2017-05-02 | Disposition: A | Discharge status: Home / Self Care | Payer: Managed Care, Other (non HMO) | Attending: Emergency Medicine | Admitting: Emergency Medicine

## 2017-05-02 ENCOUNTER — Other Ambulatory Visit: Payer: Self-pay

## 2017-05-02 ENCOUNTER — Emergency Department (HOSPITAL_BASED_OUTPATIENT_CLINIC_OR_DEPARTMENT_OTHER): Payer: Managed Care, Other (non HMO)

## 2017-05-02 DIAGNOSIS — K219 Gastro-esophageal reflux disease without esophagitis: Secondary | ICD-10-CM | POA: Insufficient documentation

## 2017-05-02 DIAGNOSIS — Z79899 Other long term (current) drug therapy: Secondary | ICD-10-CM | POA: Diagnosis not present

## 2017-05-02 DIAGNOSIS — R079 Chest pain, unspecified: Secondary | ICD-10-CM | POA: Diagnosis present

## 2017-05-02 LAB — TROPONIN I

## 2017-05-02 LAB — HEPATIC FUNCTION PANEL
ALT: 31 U/L (ref 14–54)
AST: 29 U/L (ref 15–41)
Albumin: 4.1 g/dL (ref 3.5–5.0)
Alkaline Phosphatase: 77 U/L (ref 38–126)
BILIRUBIN TOTAL: 0.6 mg/dL (ref 0.3–1.2)
Bilirubin, Direct: <0.1 mg/dL — ABNORMAL LOW (ref 0.1–0.5)
TOTAL PROTEIN: 7.7 g/dL (ref 6.5–8.1)

## 2017-05-02 LAB — CBC
HCT: 41.2 % (ref 36.0–46.0)
Hemoglobin: 14 g/dL (ref 12.0–15.0)
MCH: 30.6 pg (ref 26.0–34.0)
MCHC: 34 g/dL (ref 30.0–36.0)
MCV: 90.2 fL (ref 78.0–100.0)
PLATELETS: 322 10*3/uL (ref 150–400)
RBC: 4.57 MIL/uL (ref 3.87–5.11)
RDW: 12.5 % (ref 11.5–15.5)
WBC: 10.5 10*3/uL (ref 4.0–10.5)

## 2017-05-02 LAB — LIPASE, BLOOD: Lipase: 53 U/L — ABNORMAL HIGH (ref 11–51)

## 2017-05-02 LAB — BASIC METABOLIC PANEL
Anion gap: 9 (ref 5–15)
BUN: 19 mg/dL (ref 6–20)
CALCIUM: 9 mg/dL (ref 8.9–10.3)
CHLORIDE: 104 mmol/L (ref 101–111)
CO2: 24 mmol/L (ref 22–32)
CREATININE: 0.83 mg/dL (ref 0.44–1.00)
GFR calc non Af Amer: >60 mL/min (ref >60)
Glucose, Bld: 120 mg/dL — ABNORMAL HIGH (ref 65–99)
Potassium: 3.7 mmol/L (ref 3.5–5.1)
SODIUM: 137 mmol/L (ref 135–145)

## 2017-05-02 MED ORDER — GI COCKTAIL ~~LOC~~
30.0000 mL | Freq: Once | ORAL | Status: AC
  Administered 2017-05-02: 30 mL via ORAL
  Filled 2017-05-02: qty 30

## 2017-05-02 MED ORDER — SUCRALFATE 1 GM/10ML PO SUSP: 1.0000 g | Freq: Three times a day (TID) | ORAL | 0 refills | Status: DC

## 2017-05-02 NOTE — ED Triage Notes (Signed)
C/o sharp pain in back radiating to left chest  Feels as if chest is getting tight onset 30 minutes pta  Denies n/v  No sob.  Pain does not change w movement
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
## 2017-08-06 ENCOUNTER — Encounter: Payer: Self-pay | Admitting: Obstetrics & Gynecology

## 2017-08-06 ENCOUNTER — Other Ambulatory Visit: Payer: Self-pay

## 2017-08-06 ENCOUNTER — Ambulatory Visit (INDEPENDENT_AMBULATORY_CARE_PROVIDER_SITE_OTHER): Payer: Managed Care, Other (non HMO) | Admitting: Obstetrics & Gynecology

## 2017-08-06 ENCOUNTER — Other Ambulatory Visit (HOSPITAL_COMMUNITY)
Admission: RE | Admit: 2017-08-06 | Discharge: 2017-08-06 | Disposition: A | Discharge status: Home / Self Care | Payer: Managed Care, Other (non HMO) | Source: Ambulatory Visit | Attending: Obstetrics & Gynecology | Admitting: Obstetrics & Gynecology

## 2017-08-06 VITALS — BP 102/70 | HR 100 | Resp 18 | Ht 61.0 in | Wt 179.0 lb

## 2017-08-06 DIAGNOSIS — Z01419 Encounter for gynecological examination (general) (routine) without abnormal findings: Secondary | ICD-10-CM | POA: Diagnosis not present

## 2017-08-06 DIAGNOSIS — Z124 Encounter for screening for malignant neoplasm of cervix: Secondary | ICD-10-CM | POA: Diagnosis not present
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
## 2017-08-08 LAB — CYTOLOGY - PAP
Diagnosis: NEGATIVE
HPV (WINDOPATH): NOT DETECTED

## 2017-09-21 ENCOUNTER — Telehealth: Payer: Self-pay

## 2017-09-21 NOTE — Telephone Encounter (Signed)
Called patient to discuss normal BMD results. Per DPR left detailed message on voicemail with normal results and stating to repeat in 5 years.

## 2017-10-15 ENCOUNTER — Encounter: Payer: Self-pay | Admitting: Obstetrics & Gynecology

## 2018-08-29 ENCOUNTER — Other Ambulatory Visit: Payer: Self-pay | Admitting: Orthopedic Surgery

## 2018-08-29 DIAGNOSIS — M25562 Pain in left knee: Secondary | ICD-10-CM

## 2018-09-12 ENCOUNTER — Other Ambulatory Visit: Payer: Managed Care, Other (non HMO)

## 2018-10-01 ENCOUNTER — Other Ambulatory Visit (HOSPITAL_COMMUNITY): Payer: Self-pay | Admitting: Family Medicine

## 2018-10-01 ENCOUNTER — Other Ambulatory Visit: Payer: Self-pay | Admitting: Family Medicine

## 2018-10-01 DIAGNOSIS — G453 Amaurosis fugax: Secondary | ICD-10-CM

## 2018-10-07 ENCOUNTER — Other Ambulatory Visit (HOSPITAL_COMMUNITY): Payer: Self-pay | Admitting: Family Medicine

## 2018-10-07 ENCOUNTER — Ambulatory Visit (HOSPITAL_BASED_OUTPATIENT_CLINIC_OR_DEPARTMENT_OTHER)
Admission: RE | Admit: 2018-10-07 | Discharge: 2018-10-07 | Disposition: A | Discharge status: Home / Self Care | Payer: Managed Care, Other (non HMO) | Source: Ambulatory Visit | Attending: Family Medicine | Admitting: Family Medicine

## 2018-10-07 ENCOUNTER — Other Ambulatory Visit: Payer: Self-pay

## 2018-10-07 ENCOUNTER — Ambulatory Visit (HOSPITAL_COMMUNITY)
Admission: RE | Admit: 2018-10-07 | Discharge: 2018-10-07 | Disposition: A | Discharge status: Home / Self Care | Payer: Managed Care, Other (non HMO) | Source: Ambulatory Visit | Attending: Family Medicine | Admitting: Family Medicine

## 2018-10-07 DIAGNOSIS — G453 Amaurosis fugax: Secondary | ICD-10-CM

## 2018-10-07 NOTE — Progress Notes (Signed)
Carotid artery duplex has been completed. Preliminary results can be found in CV Proc through chart review.   10/07/18 3:28 PM Jasmine Hicks RVT

## 2018-10-18 ENCOUNTER — Ambulatory Visit: Payer: Managed Care, Other (non HMO) | Admitting: Obstetrics & Gynecology

## 2018-10-21 IMAGING — DX DG CHEST 2V
2 series · 2 of 2 positions shown · non-contrast
Comparison: 08/11/2015

CLINICAL DATA: Patient c/o fever, dry cough, posterior chest pain,
shortness of breath with exertion; hx non smoker

EXAM:
CHEST  2 VIEW

[dg chest 2 view (1 of 2)]
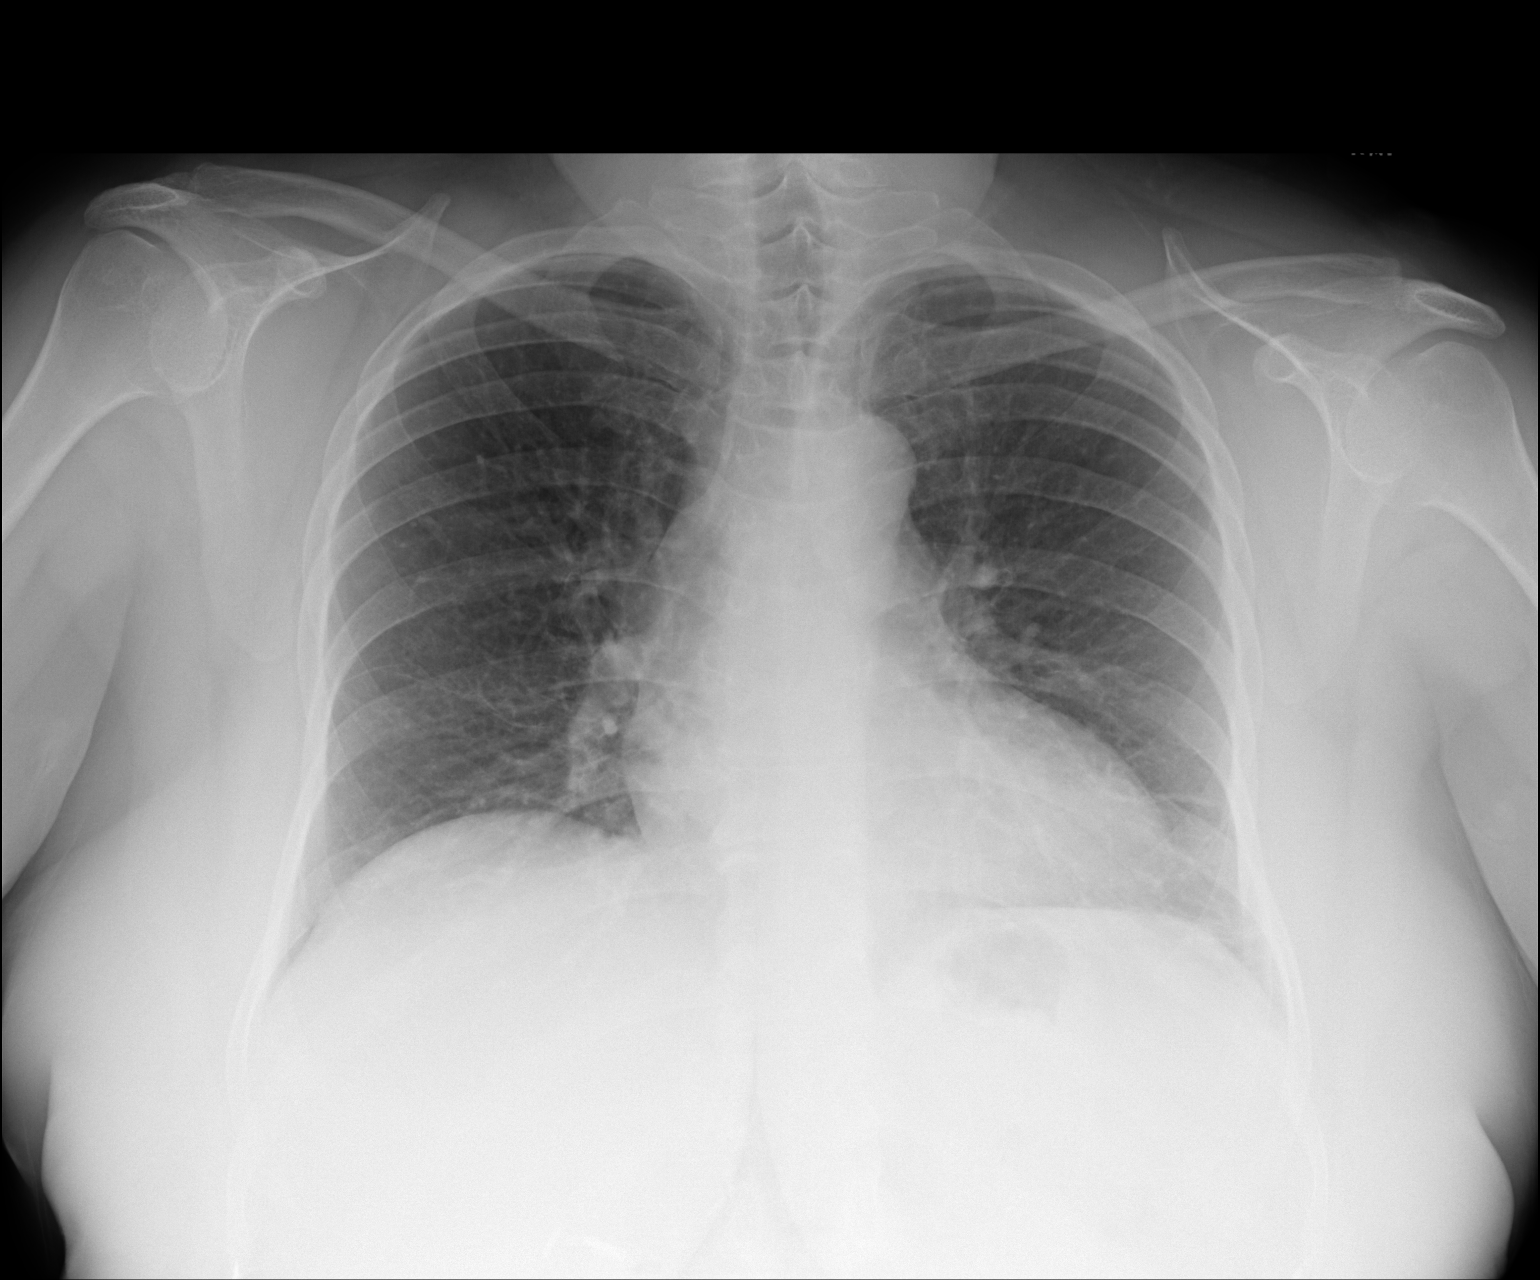

[dg chest 2 view (2 of 2)]
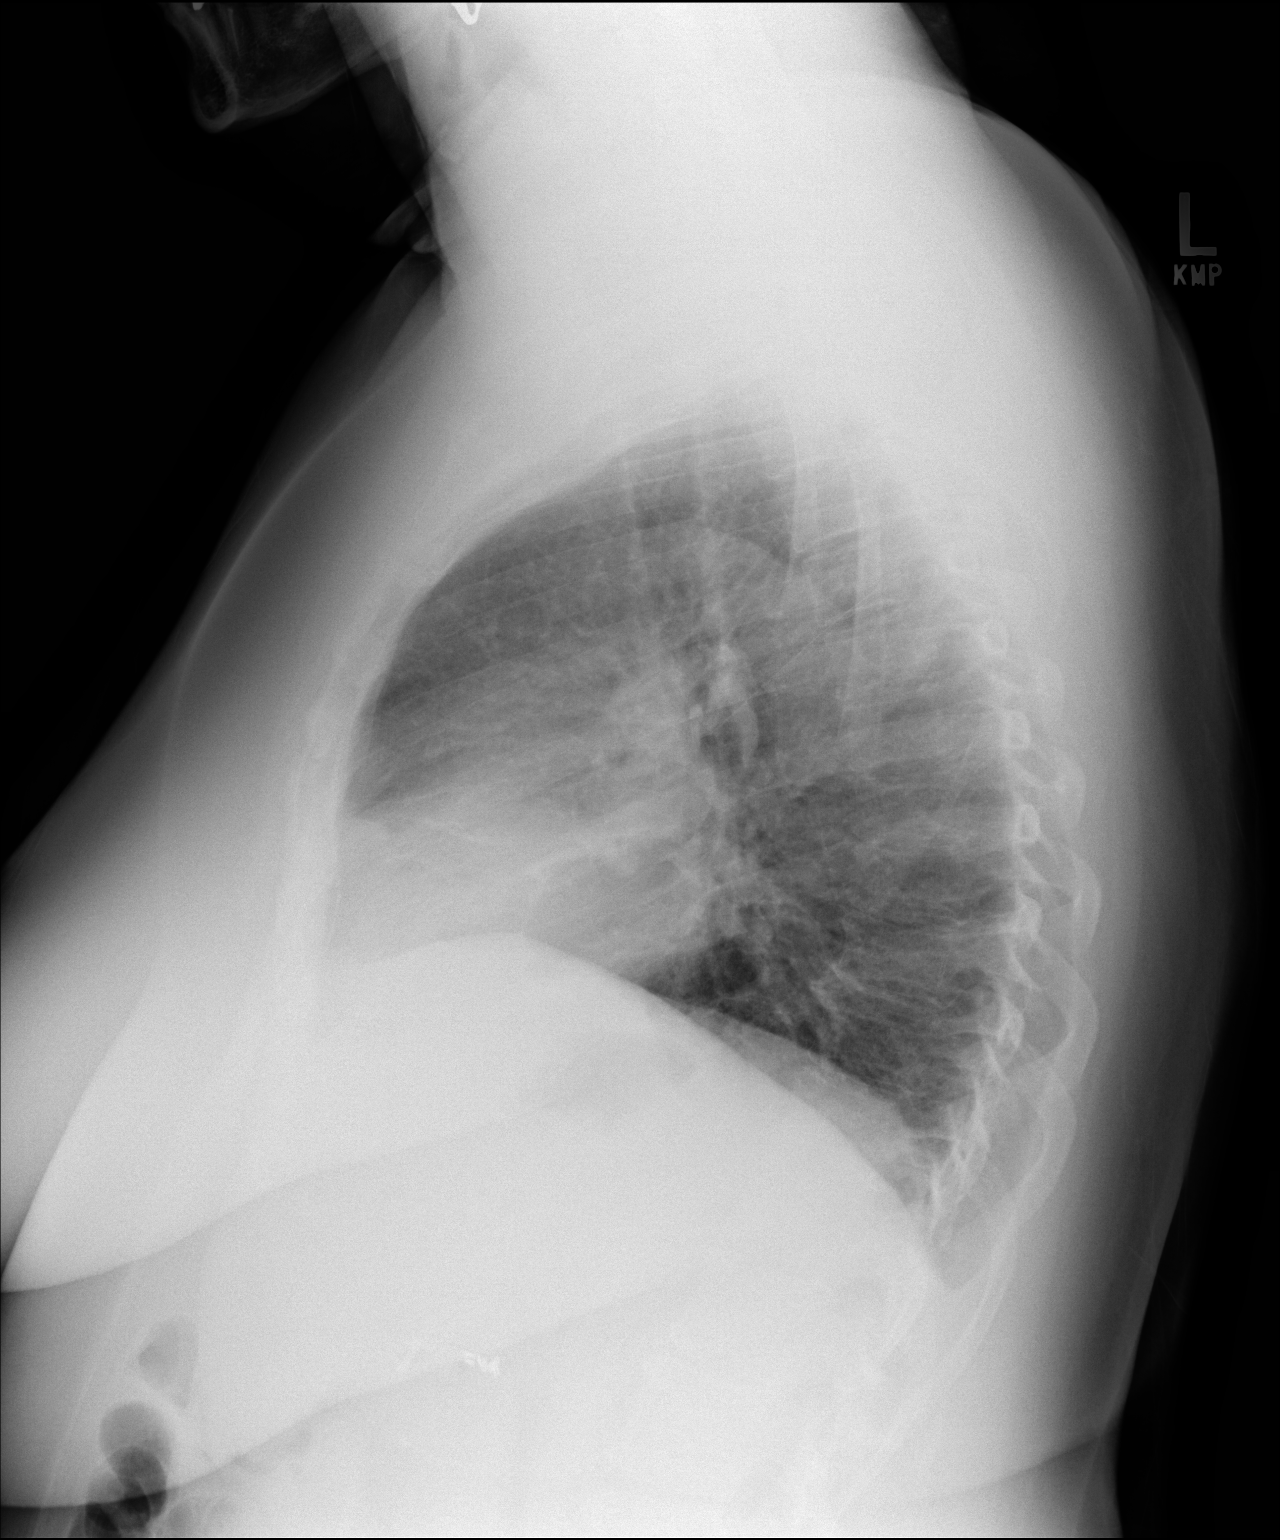

[2 of 2 positions shown; findings below may reference images not displayed]

FINDINGS: The heart size and mediastinal contours are within normal limits.
Both lungs are clear. The visualized skeletal structures are
unremarkable.
IMPRESSION: No active cardiopulmonary disease.

## 2019-01-17 ENCOUNTER — Ambulatory Visit: Payer: Managed Care, Other (non HMO) | Admitting: Obstetrics & Gynecology

## 2019-02-19 ENCOUNTER — Encounter: Payer: Self-pay | Admitting: Obstetrics & Gynecology

## 2019-04-18 ENCOUNTER — Other Ambulatory Visit: Payer: Self-pay

## 2019-04-22 ENCOUNTER — Ambulatory Visit (INDEPENDENT_AMBULATORY_CARE_PROVIDER_SITE_OTHER): Payer: Managed Care, Other (non HMO) | Admitting: Obstetrics & Gynecology

## 2019-04-22 ENCOUNTER — Other Ambulatory Visit: Payer: Self-pay

## 2019-04-22 ENCOUNTER — Encounter: Payer: Self-pay | Admitting: Obstetrics & Gynecology

## 2019-04-22 ENCOUNTER — Ambulatory Visit: Payer: TRICARE For Life (TFL) | Admitting: Obstetrics & Gynecology

## 2019-04-22 VITALS — BP 118/70 | HR 88 | Temp 97.0°F | Resp 12 | Ht 61.0 in | Wt 188.0 lb

## 2019-04-22 DIAGNOSIS — Z01419 Encounter for gynecological examination (general) (routine) without abnormal findings: Secondary | ICD-10-CM

## 2019-04-22 NOTE — Patient Instructions (Signed)
[email or web address]  [facility] [address], [place], [place] [address] Phone: [phone]
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
## 2019-10-24 ENCOUNTER — Other Ambulatory Visit: Payer: Self-pay | Admitting: Orthopedic Surgery

## 2019-10-24 DIAGNOSIS — M25562 Pain in left knee: Secondary | ICD-10-CM

## 2022-09-21 ENCOUNTER — Ambulatory Visit: Payer: Medicare Other | Admitting: Internal Medicine

## 2022-09-21 ENCOUNTER — Encounter: Payer: Self-pay | Admitting: Internal Medicine

## 2022-09-21 VITALS — BP 120/80 | HR 95 | Ht 62.0 in | Wt 176.2 lb

## 2022-09-21 DIAGNOSIS — K219 Gastro-esophageal reflux disease without esophagitis: Secondary | ICD-10-CM

## 2022-09-21 DIAGNOSIS — J479 Bronchiectasis, uncomplicated: Secondary | ICD-10-CM

## 2022-09-21 DIAGNOSIS — R0602 Shortness of breath: Secondary | ICD-10-CM | POA: Diagnosis not present

## 2022-09-21 DIAGNOSIS — R042 Hemoptysis: Secondary | ICD-10-CM | POA: Diagnosis not present
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
## 2022-09-21 NOTE — Patient Instructions (Signed)
Please schedule follow up scheduled with myself in 2 months.  If my schedule is not open yet, we will contact you with a reminder closer to that time. Please call [phone] if you haven't heard from [place] a month before.   Before your next visit I would like you to have: Full set of PFTs - 1 hr  I would like you to pick up a copy of the CD of ct scan of your chest at [facility] and bring it with you to next appointment.  Use a water based lubricant like AYR gel on a q-tip inside your nose for moisture. Do not use petroleum/oil based products.  Continue albuterol inhaler as needed.  For reflux - continue pantoprazole, sleep upright at 45 deg at night.   What is GERD? Gastroesophageal reflux disease (GERD) is gastroesophageal reflux diseasewhich occurs when the lower esophageal sphincter (LES) opens spontaneously, for varying periods of time, or does not close properly and stomach contents rise up into the esophagus. GER is also called acid reflux or acid regurgitation, because digestive juices--called acids--rise up with the food. The esophagus is the tube that carries food from the mouth to the stomach. The LES is a ring of muscle at the bottom of the esophagus that acts like a valve between the esophagus and stomach.  When acid reflux occurs, food or fluid can be tasted in the back of the mouth. When refluxed stomach acid touches the lining of the esophagus it may cause a burning sensation in the chest or throat called heartburn or acid indigestion. Occasional reflux is common. Persistent reflux that occurs more than twice a week is considered GERD, and it can eventually lead to more serious health problems. People of all ages can have GERD. Studies have shown that GERD may worsen or contribute to asthma, chronic cough, and pulmonary fibrosis.   What are the symptoms of GERD? The main symptom of GERD in adults is frequent heartburn, also called acid indigestion--burning-type pain in the lower part  of the mid-chest, behind the breast bone, and in the mid-abdomen.  Not all reflux is acidic in nature, and many patients don't have heart burn at all. Sometimes it feels like a cough (either dry or with mucus), choking sensation, asthma, shortness of breath, waking up at night, frequent throat clearing, or trouble swallowing.    What causes GERD? The reason some people develop GERD is still unclear. However, research shows that in people with GERD, the LES relaxes while the rest of the esophagus is working. Anatomical abnormalities such as a hiatal hernia may also contribute to GERD. A hiatal hernia occurs when the upper part of the stomach and the LES move above the diaphragm, the muscle wall that separates the stomach from the chest. Normally, the diaphragm helps the LES keep acid from rising up into the esophagus. When a hiatal hernia is present, acid reflux can occur more easily. A hiatal hernia can occur in people of any age and is most often a normal finding in otherwise healthy people over age 53. Most of the time, a hiatal hernia produces no symptoms.   Other factors that may contribute to GERD include - Obesity or recent weight gain - Pregnancy  - Smoking  - Diet - Certain medications  Common foods that can worsen reflux symptoms include: - carbonated beverages - artificial sweeteners - citrus fruits  - chocolate  - drinks with caffeine or alcohol  - fatty and fried foods  - garlic and  onions  - mint flavorings  - spicy foods  - tomato-based foods, like spaghetti sauce, salsa, chili, and pizza   Lifestyle Changes If you smoke, stop.  Avoid foods and beverages that worsen symptoms (see above.) Lose weight if needed.  Eat small, frequent meals.  Wear loose-fitting clothes.  Avoid lying down for 3 hours after a meal.  Raise the head of your bed 6 to 8 inches by securing wood blocks under the bedposts. Just using extra pillows will not help, but using a wedge-shaped pillow may be  helpful.  Medications  H2 blockers, such as cimetidine (Tagamet HB), famotidine (Pepcid AC), nizatidine (Axid AR), and ranitidine (Zantac 75), decrease acid production. They are available in prescription strength and over-the-counter strength. These drugs provide short-term relief and are effective for about half of those who have GERD symptoms.  Proton pump inhibitors include omeprazole (Prilosec, Zegerid), lansoprazole (Prevacid), pantoprazole (Protonix), rabeprazole (Aciphex), and esomeprazole (Nexium), which are available by prescription. Prilosec is also available in over-the-counter strength. Proton pump inhibitors are more effective than H2 blockers and can relieve symptoms and heal the esophageal lining in almost everyone who has GERD.  Because drugs work in different ways, combinations of medications may help control symptoms. People who get heartburn after eating may take both antacids and H2 blockers. The antacids work first to neutralize the acid in the stomach, and then the H2 blockers act on acid production. By the time the antacid stops working, the H2 blocker will have stopped acid production. Your health care provider is the best source of information about how to use medications for GERD.   Points to Remember 1. You can have GERD without having heartburn. Your symptoms could include a dry cough, asthma symptoms, or trouble swallowing.  2. Taking medications daily as prescribed is important in controlling you symptoms.  Sometimes it can take up to 8 weeks to fully achieve the effects of the medications prescribed.  3. Coughing related to GERD can be difficult to treat and is very frustrating!  However, it is important to stick with these medications and lifestyle modifications before pursuing more aggressive or invasive test and treatments.

## 2022-11-21 ENCOUNTER — Telehealth: Payer: Self-pay | Admitting: Internal Medicine

## 2022-11-21 NOTE — Telephone Encounter (Signed)
CD received from [facility].  CD contains chest xray and CT chest 08/17/22.  Images have been uploaded through [facility] and are available through PACS for viewing. CD with radiology reports are in Dr. Humphrey Rolls pod box.

## 2022-11-29 ENCOUNTER — Ambulatory Visit: Payer: Medicare Other | Admitting: Internal Medicine

## 2022-11-29 DIAGNOSIS — R0602 Shortness of breath: Secondary | ICD-10-CM

## 2022-11-29 DIAGNOSIS — J479 Bronchiectasis, uncomplicated: Secondary | ICD-10-CM

## 2022-11-29 LAB — PULMONARY FUNCTION TEST
DL/VA % pred: 131 %
DL/VA: 5.53 ml/min/mmHg/L
DLCO cor % pred: 114 %
DLCO cor: 20.93 ml/min/mmHg
DLCO unc % pred: 114 %
DLCO unc: 20.93 ml/min/mmHg
FEF 25-75 Post: 1.77 L/sec
FEF 25-75 Pre: 1.88 L/sec
FEF2575-%Change-Post: -5 %
FEF2575-%Pred-Post: 96 %
FEF2575-%Pred-Pre: 102 %
FEV1-%Change-Post: -2 %
FEV1-%Pred-Post: 88 %
FEV1-%Pred-Pre: 90 %
FEV1-Post: 1.87 L
FEV1-Pre: 1.92 L
FEV1FVC-%Change-Post: 4 %
FEV1FVC-%Pred-Pre: 105 %
FEV6-%Change-Post: 0 %
FEV6-%Pred-Post: 83 %
FEV6-%Pred-Pre: 83 %
FEV6-Post: 2.22 L
FEV6-Pre: 2.23 L
FEV6FVC-%Change-Post: 0 %
FEV6FVC-%Pred-Post: 104 %
FEV6FVC-%Pred-Pre: 104 %
FVC-%Change-Post: -6 %
FVC-%Pred-Post: 79 %
FVC-%Pred-Pre: 85 %
FVC-Post: 2.22 L
FVC-Pre: 2.39 L
Post FEV1/FVC ratio: 84 %
Post FEV6/FVC ratio: 100 %
Pre FEV1/FVC ratio: 80 %
Pre FEV6/FVC Ratio: 100 %

## 2022-11-29 NOTE — Patient Instructions (Signed)
Full PFT performed today except Pleth due to claustrophobia.

## 2022-11-29 NOTE — Progress Notes (Signed)
Full PFT performed today except Pleth due to claustrophobia.

## 2022-12-11 ENCOUNTER — Ambulatory Visit (INDEPENDENT_AMBULATORY_CARE_PROVIDER_SITE_OTHER): Payer: Medicare Other | Admitting: Internal Medicine

## 2022-12-11 ENCOUNTER — Encounter: Payer: Self-pay | Admitting: Internal Medicine

## 2022-12-11 VITALS — BP 120/80 | HR 89 | Temp 98.2°F | Ht 62.0 in | Wt 173.8 lb

## 2022-12-11 DIAGNOSIS — J479 Bronchiectasis, uncomplicated: Secondary | ICD-10-CM

## 2022-12-11 DIAGNOSIS — J4520 Mild intermittent asthma, uncomplicated: Secondary | ICD-10-CM

## 2022-12-11 MED ORDER — FLUTICASONE-SALMETEROL 230-21 MCG/ACT IN AERO: 2.0000 | INHALATION_SPRAY | Freq: Two times a day (BID) | RESPIRATORY_TRACT | 12 refills | Status: DC

## 2022-12-11 NOTE — Patient Instructions (Addendum)
Please schedule follow up scheduled with myself in 6 months.  If my schedule is not open yet, we will contact you with a reminder closer to that time. Please call [phone] if you haven't heard from [place] a month before.   Lung function testing was normal.  I think your symptoms could be related to mild asthma.  Start advair inhaler 2 puffs twice daily as needed (see instructions below.) gargle after use.  Continue albuterol inhaler as needed. If you are using it more than 2 times in a week, start taking the new adair inhaler.   I am recommending starting an inhaler for your asthma. Because you do not have daily symptoms, using this inhaler on an as needed basis may be sufficient.  I recommend starting to use the inhaler daily as prescribed if you have symptoms of asthma such as chest tightness, wheezing, coughing, shortness of breath. You should also start using it if you have exposure to a sick contact, worsening allergies, or any other trigger for your asthma. I recommend you keep using it even after your respiratory symptoms resolve for 3-4 days. The goal of this therapy is to prevent your symptoms from becoming a flare severe enough to require steroids like prednisone.   Please call our office if using this inhaler on an as needed basis is not sufficient. You might need to be seen sooner than our scheduled follow up.

## 2022-12-11 NOTE — Progress Notes (Signed)
The patient has been prescribed the inhaler advair. Inhaler technique was demonstrated to patient. The patient subsequently demonstrated correct technique.  
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
## 2022-12-21 ENCOUNTER — Telehealth: Payer: Self-pay | Admitting: Internal Medicine

## 2022-12-21 NOTE — Telephone Encounter (Signed)
Bradly Chris husband dropped off prior authorization form for Advair. Put in pharmacy box. Bradly Chris phone number is [phone].

## 2022-12-26 ENCOUNTER — Telehealth: Payer: Self-pay

## 2022-12-26 ENCOUNTER — Other Ambulatory Visit: Payer: Self-pay

## 2022-12-26 MED ORDER — FLUTICASONE-SALMETEROL 250-50 MCG/ACT IN AEPB: 1.0000 | INHALATION_SPRAY | Freq: Two times a day (BID) | RESPIRATORY_TRACT | 11 refills | Status: DC

## 2022-12-26 NOTE — Telephone Encounter (Signed)
PA request has been Cancelled. New Encounter created for follow up. For additional info see Pharmacy Prior Auth telephone encounter from 12/26/2022.

## 2022-12-26 NOTE — Telephone Encounter (Signed)
*  Pulm  Pharmacy Patient Advocate Encounter   Received notification from Fax that prior authorization for Advair HFA is required/requested.   Insurance verification completed.   The patient is insured through [employer] .   Per test claim:  Advair Diskus/Wixela is preferred by the insurance.  If suggested medication is appropriate, Please send in a new RX and discontinue this one. If not, please advise as to why it's not appropriate so that we may request a Prior Authorization.

## 2022-12-26 NOTE — Addendum Note (Signed)
Addended by: Durel Salts on: 12/26/2022 03:44 PM   Modules accepted: Orders

## 2022-12-26 NOTE — Telephone Encounter (Signed)
[employer] PA form attached in patients media.

## 2022-12-26 NOTE — Telephone Encounter (Signed)
Pt. Calling needing the paper filled out and sent back to pharmacy

## 2022-12-27 ENCOUNTER — Other Ambulatory Visit: Payer: Self-pay

## 2022-12-27 MED ORDER — FLUTICASONE-SALMETEROL 250-50 MCG/ACT IN AEPB: 1.0000 | INHALATION_SPRAY | Freq: Two times a day (BID) | RESPIRATORY_TRACT | 11 refills | Status: DC

## 2022-12-27 NOTE — Telephone Encounter (Signed)
Closing encounter since I have already spoke with patient in regards to wixela inhaler.

## 2022-12-27 NOTE — Telephone Encounter (Signed)
Spoke with patient. Advised Jasmine Hicks has been sent to pharmacy instead of Advair. Patient verbalized understanding. NFN

## 2022-12-28 ENCOUNTER — Telehealth: Payer: Self-pay | Admitting: Internal Medicine

## 2022-12-28 DIAGNOSIS — J4520 Mild intermittent asthma, uncomplicated: Secondary | ICD-10-CM

## 2022-12-28 MED ORDER — FLUTICASONE-SALMETEROL 250-50 MCG/ACT IN AEPB: 1.0000 | INHALATION_SPRAY | Freq: Two times a day (BID) | RESPIRATORY_TRACT | 11 refills | Status: AC

## 2022-12-28 NOTE — Telephone Encounter (Signed)
Spoke with patient regarding prior message. Advised patient that script was resent to [facility] for wixela .   Patient's voice was understanding.Nothing else further needed.

## 2024-02-12 ENCOUNTER — Inpatient Hospital Stay: Attending: Hematology | Admitting: Hematology

## 2024-02-12 ENCOUNTER — Encounter: Payer: Self-pay | Admitting: Hematology

## 2024-02-12 ENCOUNTER — Inpatient Hospital Stay

## 2024-02-12 VITALS — BP 133/81 | HR 97 | Temp 97.8°F | Resp 17 | Ht 62.0 in | Wt 175.0 lb

## 2024-02-12 DIAGNOSIS — N644 Mastodynia: Secondary | ICD-10-CM | POA: Insufficient documentation

## 2024-02-12 DIAGNOSIS — Z7985 Long-term (current) use of injectable non-insulin antidiabetic drugs: Secondary | ICD-10-CM | POA: Insufficient documentation

## 2024-02-12 DIAGNOSIS — E119 Type 2 diabetes mellitus without complications: Secondary | ICD-10-CM | POA: Insufficient documentation

## 2024-02-12 DIAGNOSIS — E663 Overweight: Secondary | ICD-10-CM | POA: Insufficient documentation

## 2024-02-12 DIAGNOSIS — Z1501 Genetic susceptibility to malignant neoplasm of breast: Secondary | ICD-10-CM | POA: Insufficient documentation

## 2024-02-12 DIAGNOSIS — Z6832 Body mass index (BMI) 32.0-32.9, adult: Secondary | ICD-10-CM | POA: Insufficient documentation

## 2024-02-12 DIAGNOSIS — Z803 Family history of malignant neoplasm of breast: Secondary | ICD-10-CM | POA: Insufficient documentation
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
## 2024-03-19 ENCOUNTER — Inpatient Hospital Stay

## 2024-03-19 ENCOUNTER — Encounter: Payer: Self-pay | Admitting: Genetic Counselor

## 2024-03-19 ENCOUNTER — Inpatient Hospital Stay: Attending: Hematology | Admitting: Genetic Counselor

## 2024-03-19 DIAGNOSIS — Z800 Family history of malignant neoplasm of digestive organs: Secondary | ICD-10-CM | POA: Insufficient documentation

## 2024-03-19 DIAGNOSIS — Z1379 Encounter for other screening for genetic and chromosomal anomalies: Secondary | ICD-10-CM | POA: Insufficient documentation

## 2024-03-19 DIAGNOSIS — Z803 Family history of malignant neoplasm of breast: Secondary | ICD-10-CM | POA: Insufficient documentation

## 2024-03-19 DIAGNOSIS — Z8042 Family history of malignant neoplasm of prostate: Secondary | ICD-10-CM

## 2024-03-19 LAB — GENETIC SCREENING ORDER
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
## 2024-03-31 ENCOUNTER — Ambulatory Visit: Payer: Self-pay | Admitting: Genetic Counselor

## 2024-03-31 ENCOUNTER — Telehealth: Payer: Self-pay | Admitting: Genetic Counselor

## 2024-03-31 DIAGNOSIS — Z1379 Encounter for other screening for genetic and chromosomal anomalies: Secondary | ICD-10-CM | POA: Insufficient documentation

## 2024-03-31 NOTE — Telephone Encounter (Signed)
 I spoke to Jasmine Hicks to review results of genetic testing, completed with [facility] CancerNext +RNA insight panel. Testing did not identify any variants known to increase the risk for cancer.  Discussed that we do not know why there is cancer in the family. It is possible that the cancers in history occurred by chance or due to environmental factors. It is possible there are family members that have a variant that she did not inherit. It is also possible there is a hereditary cause for cancer that cannot be identified with current technology or due to a gene that was not tested. It will be important for her to keep in contact with genetics to keep up with whether additional testing may be needed.  Please see counseling note for further detail on this result.

## 2024-03-31 NOTE — Progress Notes (Signed)
 HPI:  Ms. Haws was previously seen in the [facility] due to a family history of cancer and concerns regarding a hereditary predisposition to cancer. Please refer to our prior cancer genetics clinic note for more information regarding our discussion, assessment and recommendations, at the time. Ms. Maj recent genetic test results were disclosed to her, as were recommendations warranted by these results. These results and recommendations are discussed in more detail below.  Results were disclosed via telephone on 03/31/24.   FAMILY HISTORY:  We obtained a detailed, 4-generation family history.  Significant diagnoses are listed below: Family History  Problem Relation Age of Onset   Hypertension Mother    Migraines Mother    Stroke Mother    Liver cancer Mother    Diabetes Father    Hypertension Father    Breast cancer Sister 16   Breast cancer Sister 47   Migraines Brother    Asthma Maternal Grandfather    Breast cancer Maternal Cousin    Lung cancer Maternal Cousin    Breast cancer Maternal Cousin 47   Colon cancer Maternal Cousin 35   Prostate cancer Maternal Cousin 56   Breast cancer Niece 44       b/l mastectomy   Breast cancer Maternal Cousin     Ms. Futrell believes her niece has had genetic testing but is unsure of her results. She is otherwise unaware of previous family history of genetic testing for hereditary cancer risks There is no reported Ashkenazi Jewish ancestry.      GENETIC TEST RESULTS: Genetic testing reported out on 03/26/24 through the CancerNext +RNA panel found no pathogenic mutations. The [facility]RNAinsight Panel includes sequencing, rearrangement analysis, and RNA analysis for the following 40 genes: APC, ATM, BAP1, BARD1, BMPR1A, BRCA1, BRCA2, BRIP1, CDH1, CDKN2A, CHEK2, FH, FLCN, MET, MLH1, MSH2, MSH6, MUTYH, NF1, NTHL1, PALB2, PMS2, PTEN, RAD51C, RAD51D, RPS20, SMAD4, STK11, TP53, TSC1, TSC2 and VHL (sequencing and  deletion/duplication); AXIN2, HOXB13, MBD4, MSH3, POLD1 and POLE (sequencing only); EPCAM and GREM1 (deletion/duplication only). The test report has been scanned into EPIC and is located under the Molecular Pathology section of the Results Review tab.  A portion of the result report is included below for reference.     We discussed with Ms. Berti that because current genetic testing is not perfect, it is possible there may be a gene mutation in one of these genes that current testing cannot detect, but that chance is small.  We also discussed, that there could be another gene that has not yet been discovered, or that we have not yet tested, that is responsible for the cancer diagnoses in the family. It is also possible there is a hereditary cause for the cancer in the family that Ms. Kolodny did not inherit and therefore was not identified in her testing.  Therefore, it is important to remain in touch with cancer genetics in the future so that we can continue to offer Ms. Mikula the most up to date genetic testing.   ADDITIONAL GENETIC TESTING: We discussed with Ms. Ortman that there are other genes that are associated with increased cancer risk that can be analyzed. Should Ms. Perz wish to pursue additional genetic testing, we are happy to discuss and coordinate this testing, at any time.    CANCER SCREENING RECOMMENDATIONS: Ms. Balderston test result is considered negative (normal).  This means that we have not identified a hereditary cause for her family history of cancer at this time. Most  cancers happen by chance and this negative test suggests that her family history of cancer may fall into this category.    Possible reasons for Ms. Mcafee's negative genetic test include:  1. There may be a gene mutation in one of these genes that current testing methods cannot detect but that chance is small.  2. There could be another gene that has not yet been discovered, or that we have not yet  tested, that is responsible for the cancer diagnoses in the family.  3.  There may be no hereditary risk for cancer in the family. The cancers in Ms. Llanas and/or her family may be sporadic/familial or due to other genetic and environmental factors. 4. It is also possible there is a hereditary cause for the cancer in the family that Ms. Severa did not inherit.  Therefore, it is recommended she continue to follow the cancer management and screening guidelines provided by her breast cancer risk clinic and primary healthcare providers. An individual's cancer risk and medical management are not determined by genetic test results alone. Overall cancer risk assessment incorporates additional factors, including personal medical history, family history, and any available genetic information that may result in a personalized plan for cancer prevention and surveillance  Given Ms. Parran's family history, we must interpret these negative results with some caution.  Families with features suggestive of hereditary risk for cancer tend to have multiple family members with cancer, diagnoses in multiple generations and diagnoses before the age of 56. Ms. Sweigert family exhibits some of these features. Thus, this result may simply reflect our current inability to detect all mutations within these genes or there may be a different gene that has not yet been discovered or tested.   Ms. Slavens has been determined to be at high risk for breast cancer. her Tyrer-Cuzick risk score is 22%.  For women with a greater than 20% lifetime risk of breast cancer, the [employer] (NCCN) recommends the following:  Breast awareness  Clinical encounter every 6-12 months to begin when identified as being at increased risk, but not before age 39  Annual mammograms. Tomosynthesis is recommended starting 10 years earlier than the youngest breast cancer diagnosis in the family or at age 32 (whichever comes  first), but not before age 75   Annual breast MRI starting 10 years earlier than the youngest breast cancer diagnosis in the family or at age 71 (whichever comes first), but not before age 52.   Ms. Weissinger may have an increased risk for breast cancer. The Alisa model has calculated her 5 year risk for breast cancer to be 4.6%. NCCN recommends that women with a 5-year risk for breast cancer over 1.7% per Alisa model:  Clinical encounter every 6-12 months, to begin when increased risk is identified  Annual screening mammogram with tomosynthesis  To begin when identified as being at increased risk  Consider risk reduction strategies, such as medication to decrease the risk for breast cancer  Breast awareness   We, therefore, discussed that it is reasonable for Ms. Mckibbin to be followed by a high-risk breast cancer clinic; in addition to a yearly mammogram and physical exam by a healthcare provider, she should discuss the usefulness of an annual breast MRI and options for risk reducing medication with the high-risk clinic providers.   RECOMMENDATIONS FOR FAMILY MEMBERS:  Individuals in this family might be at some increased risk of developing cancer, over the general population risk, simply due to the family history  of cancer.  We recommended women in this family have a yearly mammogram beginning at age 34, or 54 years younger than the earliest onset of cancer, an annual clinical breast exam, and perform monthly breast self-exams. Women in this family should also have a gynecological exam as recommended by their primary provider. All family members should be referred for colonoscopy starting at age 10, or 42 years younger than the earliest onset of cancer.  It is also possible there is a hereditary cause for the cancer in Ms. Villalva's family that she did not inherit and therefore was not identified in her.  Based on Ms. Pederson's family history, we recommended her sister and niece, who were diagnosed  with breast cancer, have genetic counseling and testing. Ms. Caporaso will let us  know if we can be of any assistance in coordinating genetic counseling and/or testing for this family member.   Genetic testing is unlikely to be helpful for her daughter based on Ms. Jeanmarie's negative test results. However, her daughter can consider genetic testing if she has any paternal family history that is concerning for a hereditary cancer syndrome.   FOLLOW-UP: Lastly, we discussed with Ms. Edelen that cancer genetics is a rapidly advancing field and it is possible that new genetic tests will be appropriate for her and/or her family members in the future. We encouraged her to remain in contact with cancer genetics on an annual basis so we can update her personal and family histories and let her know of advances in cancer genetics that may benefit this family.   Our contact number was provided. Ms. Binion questions were answered to her satisfaction, and she knows she is welcome to call us  at anytime with additional questions or concerns.   Burnard Ogren, MS, [facility] Licensed, [email or web address] [phone]

## 2024-05-07 ENCOUNTER — Other Ambulatory Visit: Payer: Self-pay | Admitting: Family Medicine

## 2024-05-07 DIAGNOSIS — Z803 Family history of malignant neoplasm of breast: Secondary | ICD-10-CM

## 2024-05-07 DIAGNOSIS — Z9189 Other specified personal risk factors, not elsewhere classified: Secondary | ICD-10-CM

## 2024-05-21 ENCOUNTER — Other Ambulatory Visit (HOSPITAL_COMMUNITY): Payer: Self-pay | Admitting: Family Medicine

## 2024-05-21 DIAGNOSIS — Z9189 Other specified personal risk factors, not elsewhere classified: Secondary | ICD-10-CM

## 2024-05-21 DIAGNOSIS — Z803 Family history of malignant neoplasm of breast: Secondary | ICD-10-CM

## 2024-08-04 ENCOUNTER — Other Ambulatory Visit (HOSPITAL_COMMUNITY)
